# Patient Record
Sex: Female | Born: 1975 | Race: White | Hispanic: No | Marital: Single | Smoking: Never smoker
Health system: Southern US, Community
[De-identification: ages and names within clinical notes are randomized; demographics above are authoritative.]

## PROBLEM LIST (undated history)

## (undated) DIAGNOSIS — I499 Cardiac arrhythmia, unspecified: Secondary | ICD-10-CM

## (undated) DIAGNOSIS — R011 Cardiac murmur, unspecified: Secondary | ICD-10-CM

## (undated) DIAGNOSIS — I10 Essential (primary) hypertension: Secondary | ICD-10-CM

## (undated) DIAGNOSIS — Z8659 Personal history of other mental and behavioral disorders: Secondary | ICD-10-CM

## (undated) DIAGNOSIS — Z8679 Personal history of other diseases of the circulatory system: Secondary | ICD-10-CM

## (undated) DIAGNOSIS — E119 Type 2 diabetes mellitus without complications: Secondary | ICD-10-CM

## (undated) DIAGNOSIS — Z8709 Personal history of other diseases of the respiratory system: Secondary | ICD-10-CM

## (undated) HISTORY — DX: Cardiac murmur, unspecified: R01.1

## (undated) HISTORY — DX: Personal history of other diseases of the respiratory system: Z87.09

## (undated) HISTORY — DX: Essential (primary) hypertension: I10

## (undated) HISTORY — DX: Personal history of other mental and behavioral disorders: Z86.59

## (undated) HISTORY — DX: Cardiac arrhythmia, unspecified: I49.9

## (undated) HISTORY — DX: Personal history of other diseases of the circulatory system: Z86.79

---

## 2004-12-20 ENCOUNTER — Emergency Department: Payer: Self-pay | Admitting: Emergency Medicine

## 2005-05-10 ENCOUNTER — Emergency Department: Payer: Self-pay | Admitting: Emergency Medicine

## 2006-09-26 ENCOUNTER — Emergency Department: Payer: Self-pay | Admitting: Emergency Medicine

## 2006-11-22 IMAGING — CT CT HEAD WITHOUT CONTRAST
2 series · 16 of 30 positions shown, 20 images · non-contrast
Comparison: none

REASON FOR EXAM: Fell.
COMMENTS:

[Series 2: without · axial · non-contrast · 0.39mm/px · z∈[+416,+536]mm · 13 of 30 slices shown, 17 images]
[im 3/30  brain]
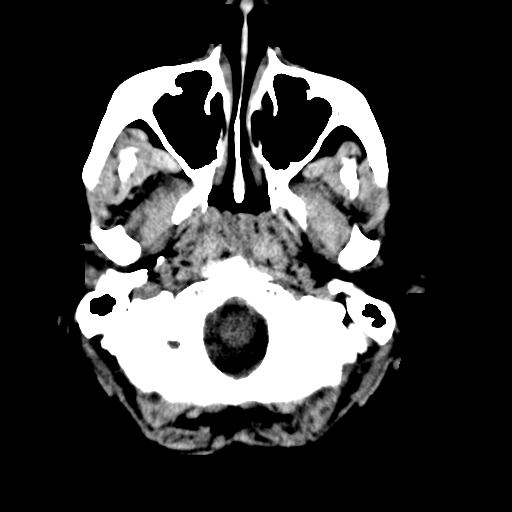
[im 3/30  bone]
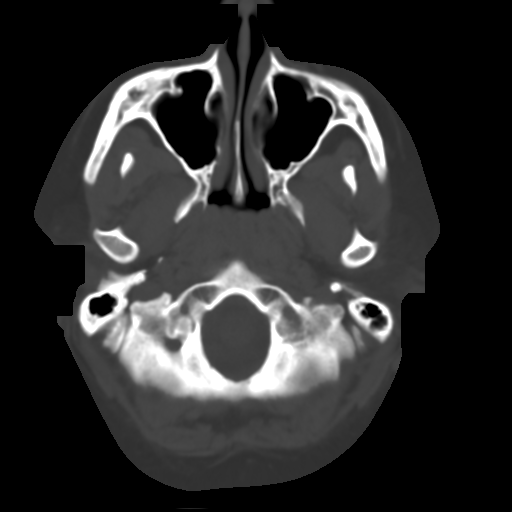
[im 5/30  brain]
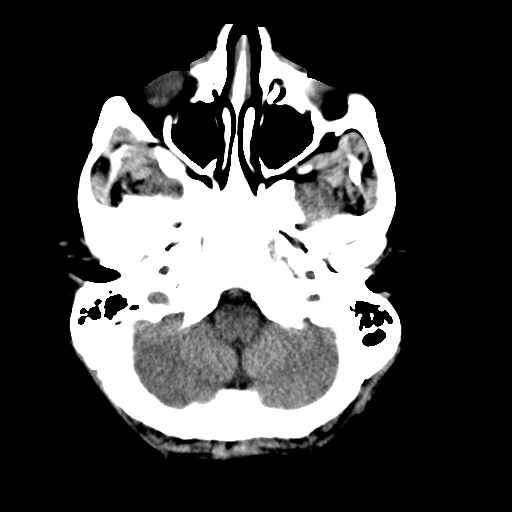
[im 7/30  brain]
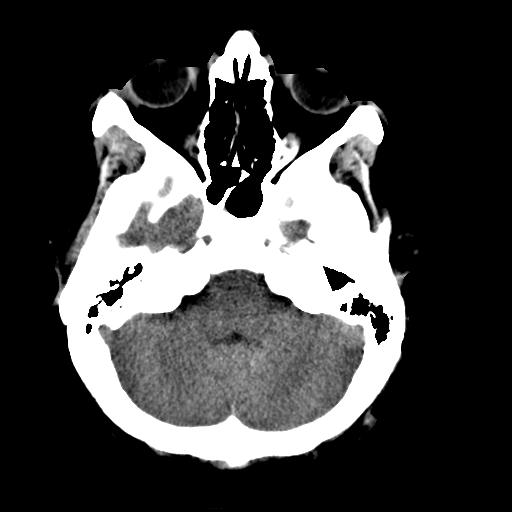
[im 9/30  brain]
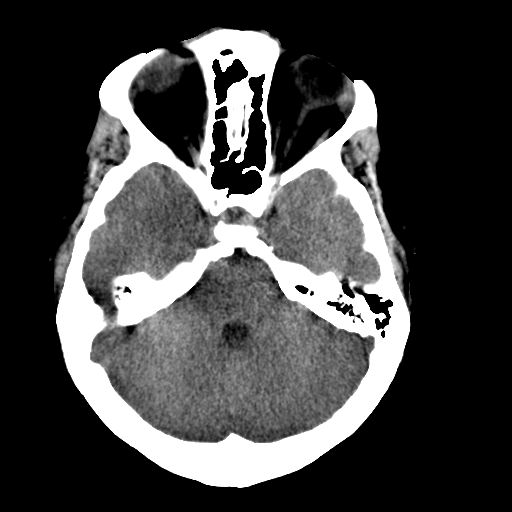
[im 11/30  brain]
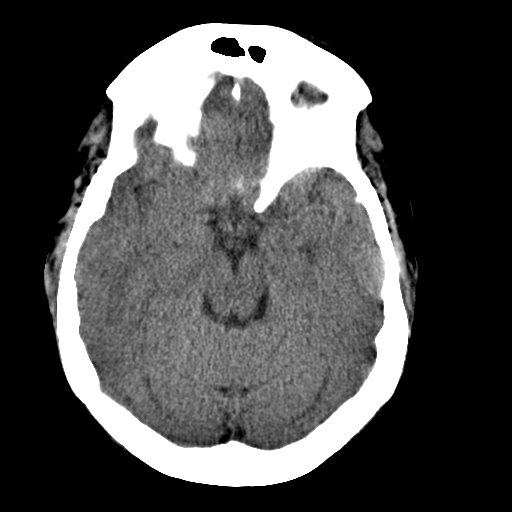
[im 11/30  bone]
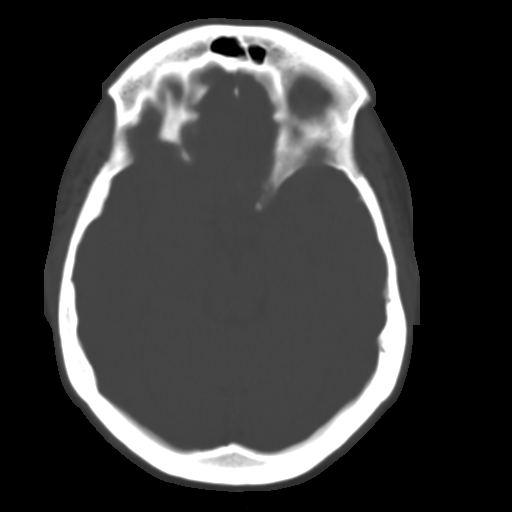
[im 13/30  brain]
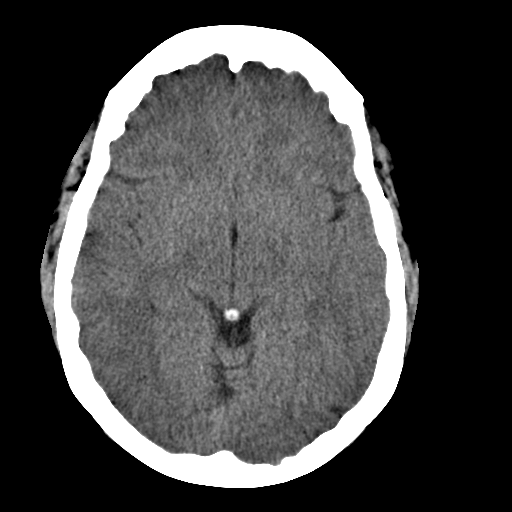
[im 15/30  brain]
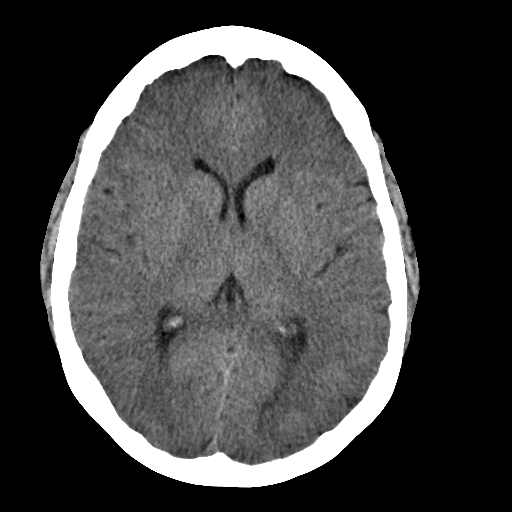
[im 17/30  brain]
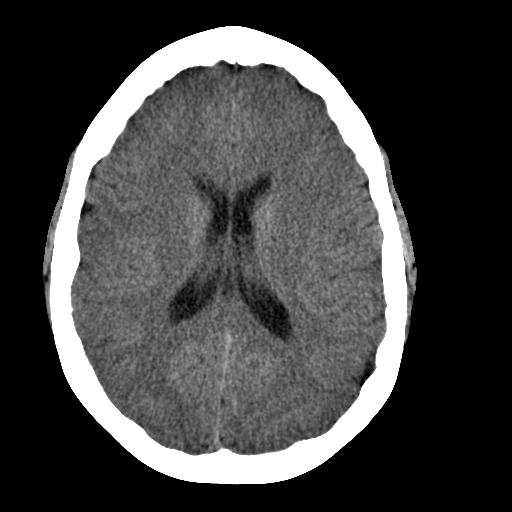
[im 19/30  brain]
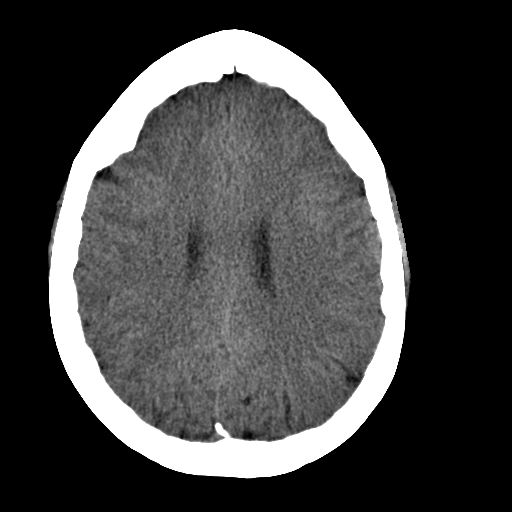
[im 19/30  bone]
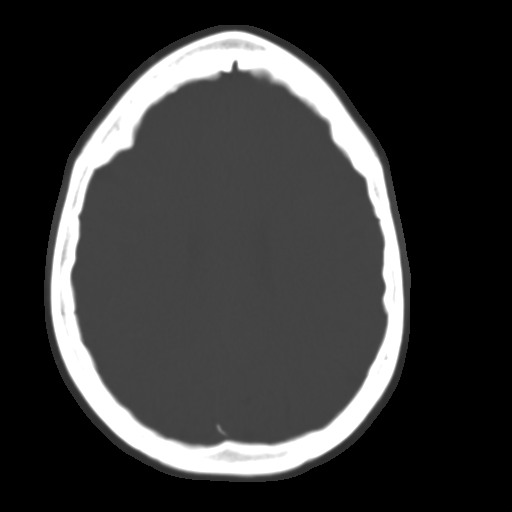
[im 21/30  brain]
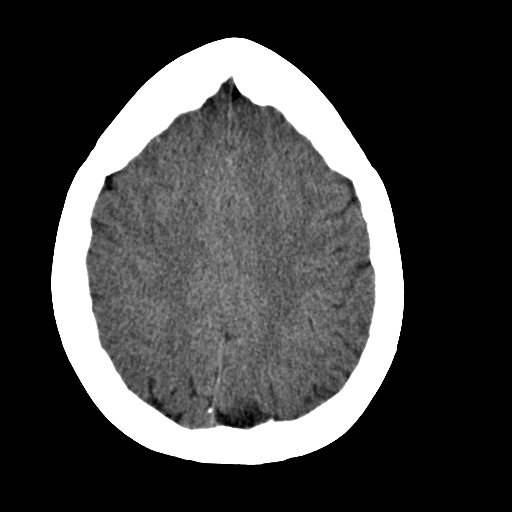
[im 23/30  brain]
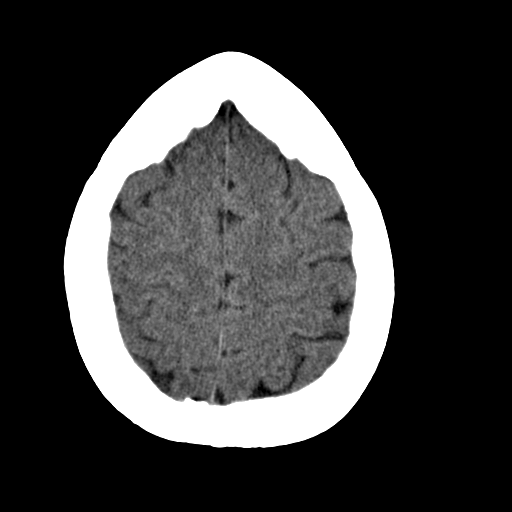
[im 25/30  brain]
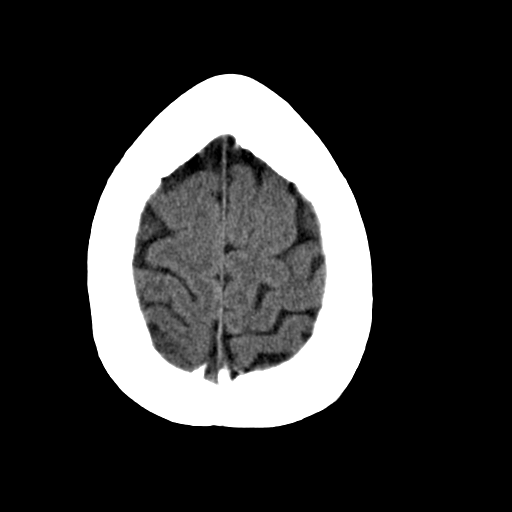
[im 27/30  brain]
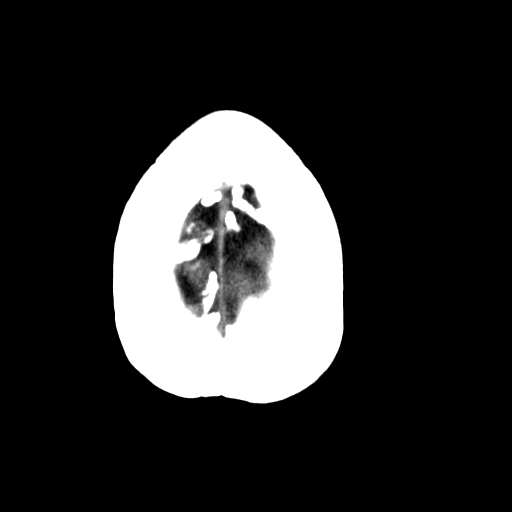
[im 27/30  bone]
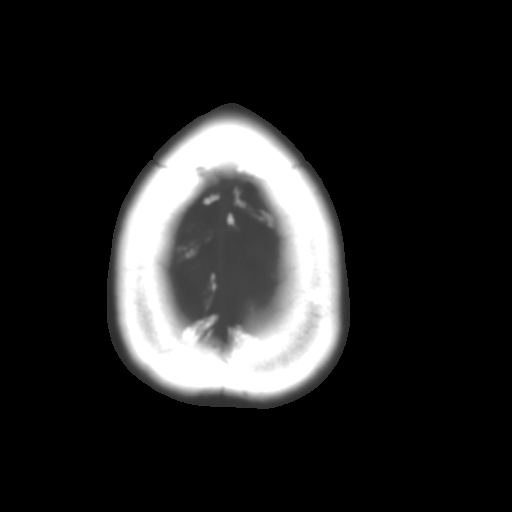

[Series 3: bone · axial · 0.39mm/px · z∈[+416,+456]mm · 3 of 30 slices shown]
[im 3/30  bone]
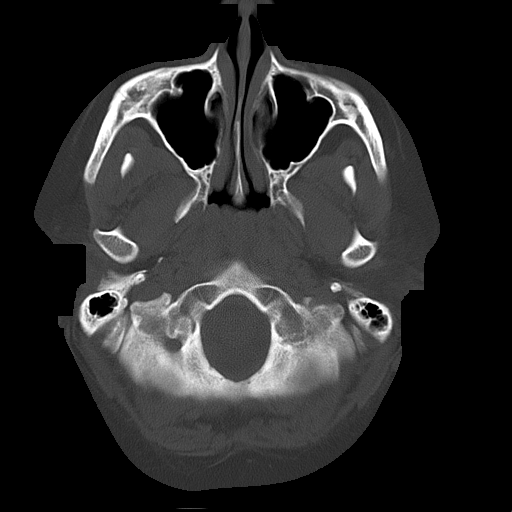
[im 7/30  bone]
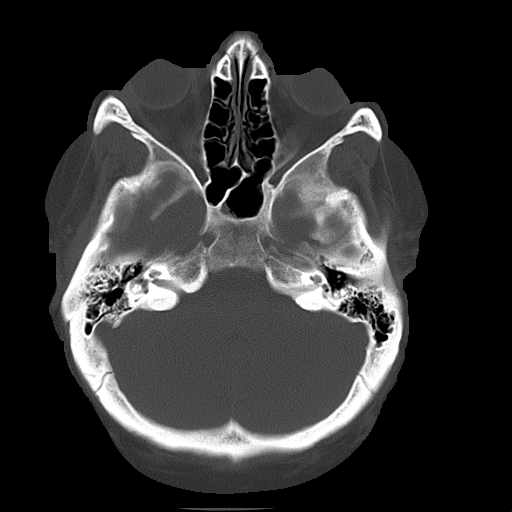
[im 11/30  bone]
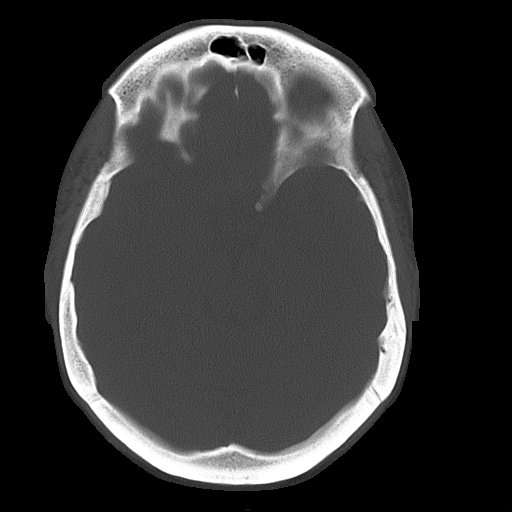

[16 of 30 positions shown; findings below may reference images not displayed]

PROCEDURE:     CT  - CT HEAD WITHOUT CONTRAST  - May 10, 2005  [DATE]

RESULT:       Unenhanced emergent head CT was performed status post fall.
The exam was originally read by the [HOSPITAL].   No intracerebral bleeds
are identified.  No mass effect and no shift of the midline.  The ventricles
appear within normal limits.  No subdural hematomas are noted.

On the bone window settings, no obvious fractures are seen.
IMPRESSION: No significant abnormalities identified on the unenhanced head CT.

## 2006-11-22 IMAGING — CR LEFT WRIST - COMPLETE 3+ VIEW
1 series · 2 of 2 positions shown · non-contrast
Comparison: none

REASON FOR EXAM: Fell. Injury.
COMMENTS:

PROCEDURE:     DXR - DXR WRIST LT COMP WITH OBLIQUES  - May 10, 2005  [DATE]
RESULT:       Multiple views reveals no fractures or dislocations.  The
joint spaces are intact.

[Series 1: view not recorded · 0.17mm/px · 2 of 2 slices shown]
[im 1/2]
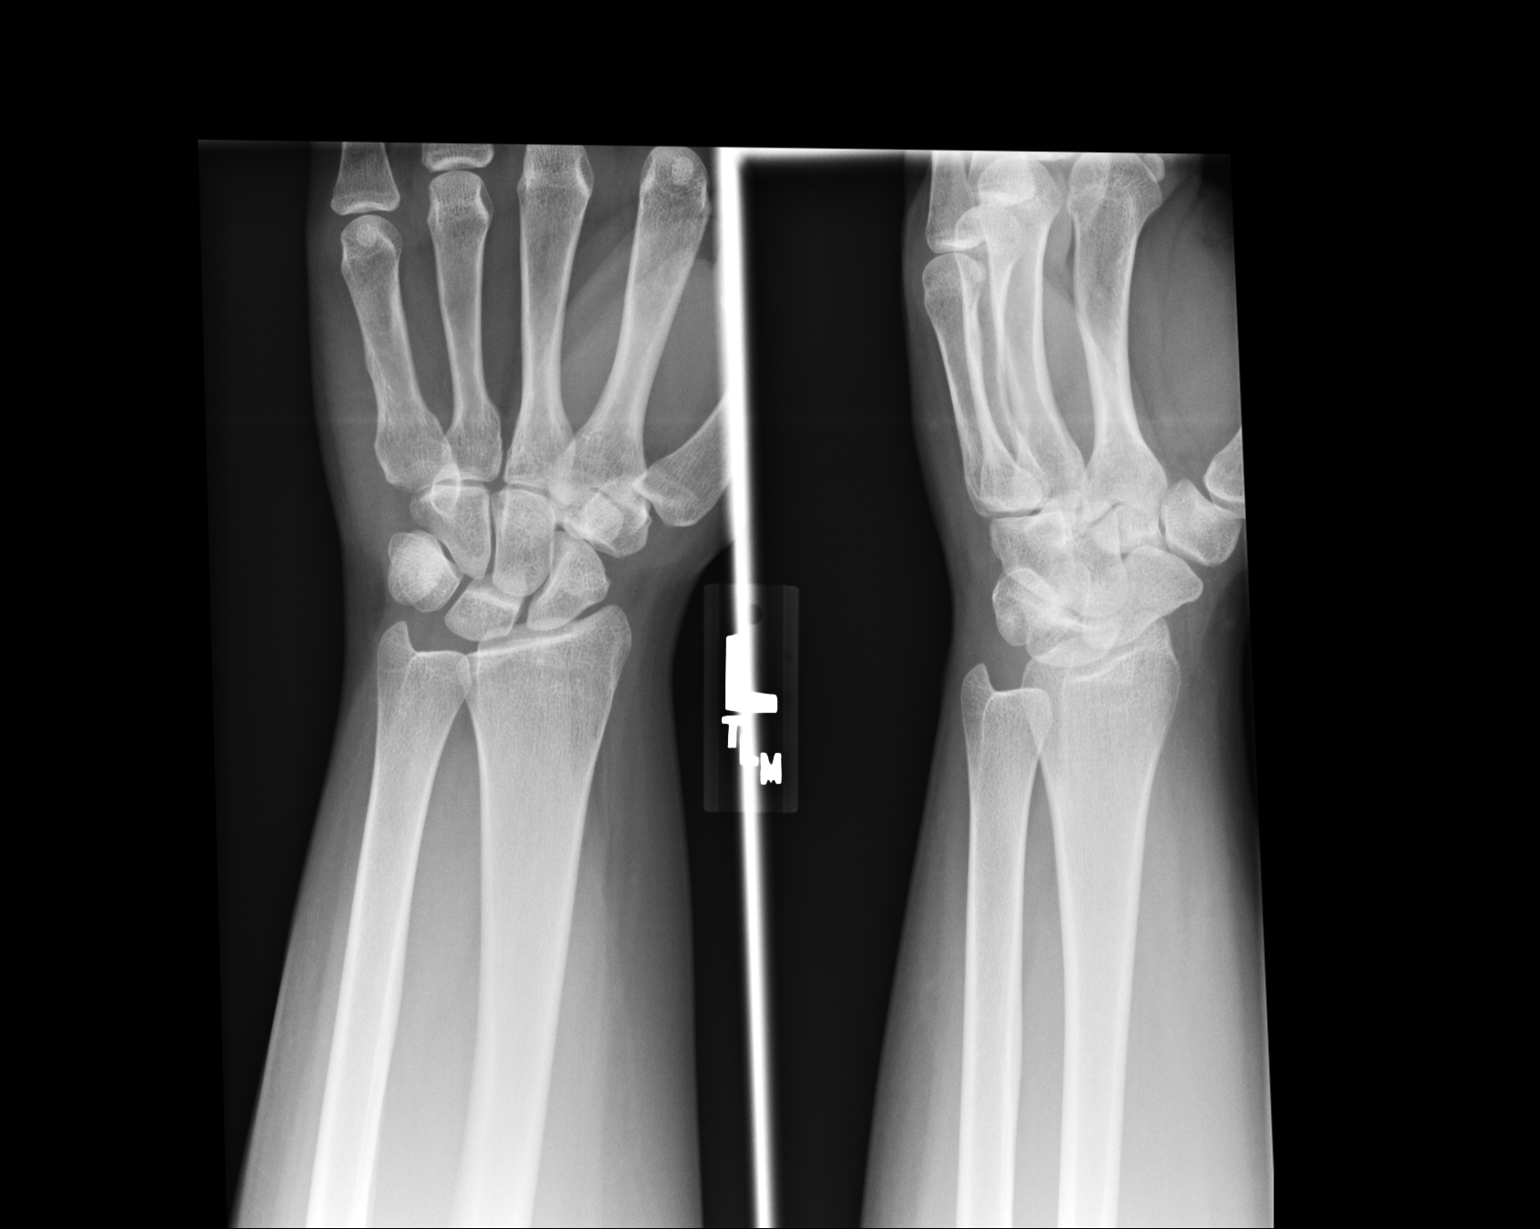
[im 2/2]
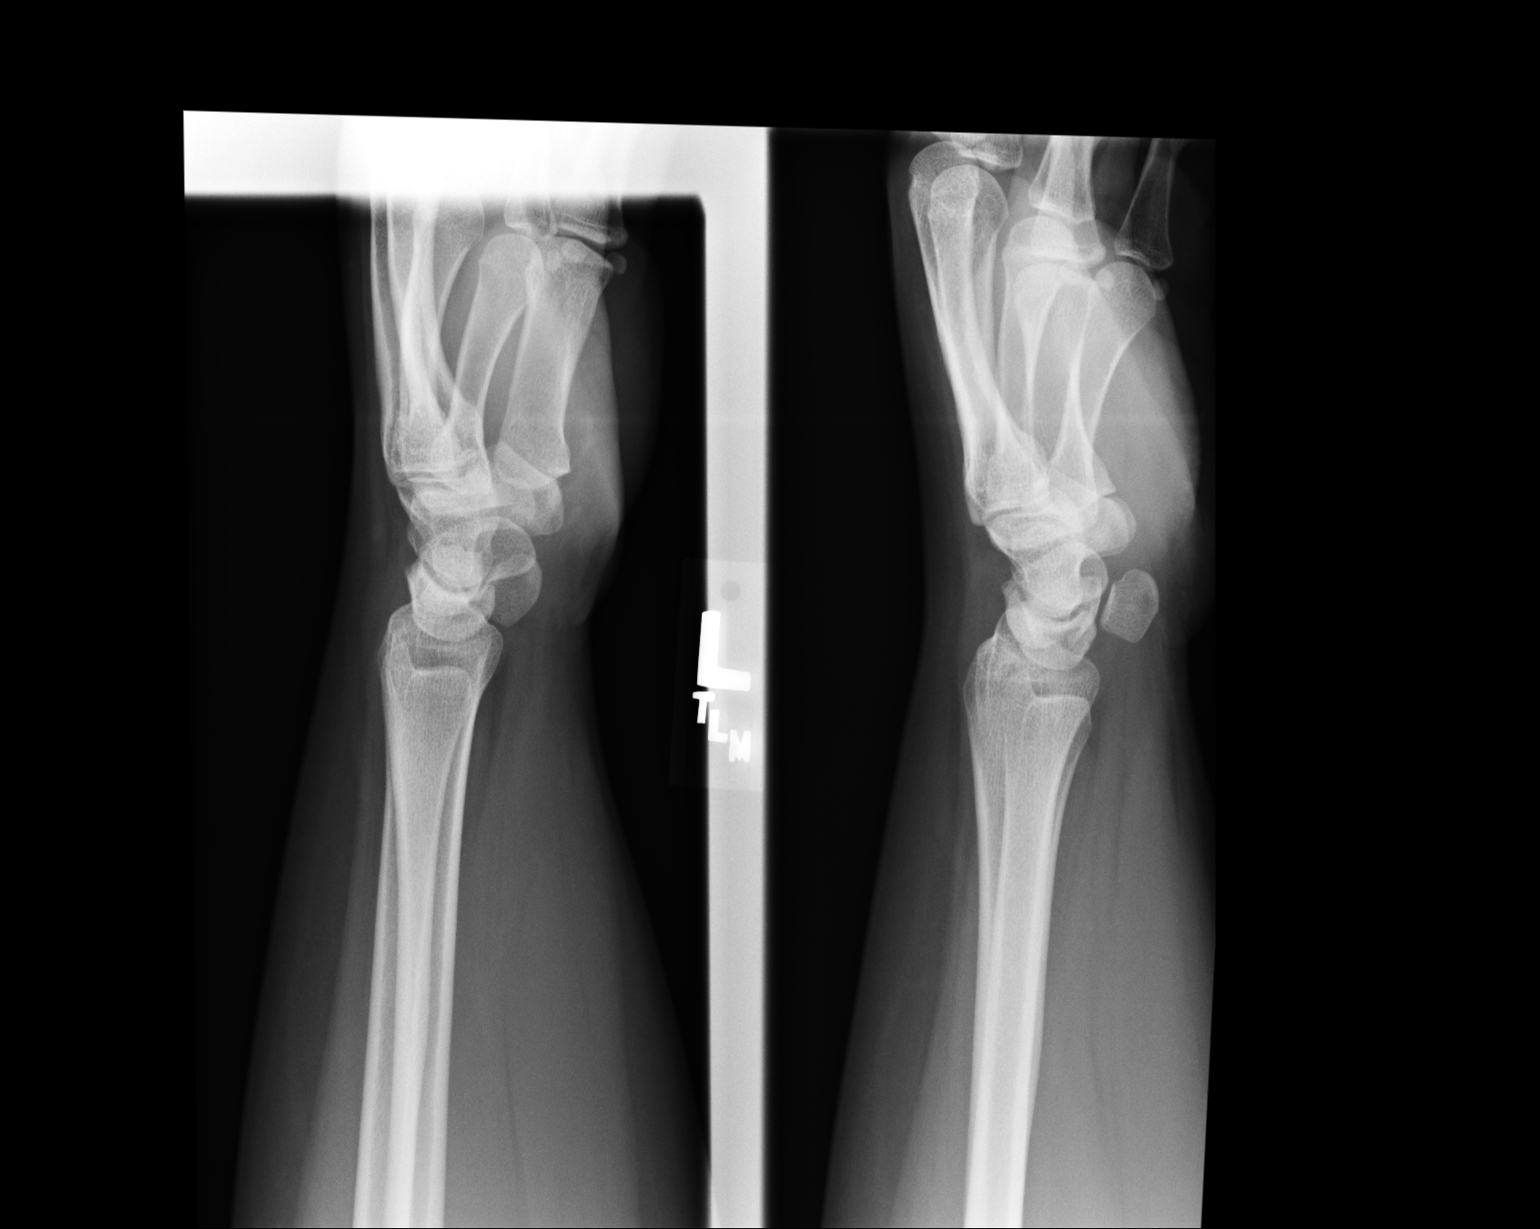

[2 of 2 positions shown; findings below may reference images not displayed]

IMPRESSION: No acute fracture seen of the LEFT wrist.

Thank you for this opportunity to contribute to the care of your patient.

A NEGATIVE MAMMOGRAM REPORT DOES NOT PRECLUDE BIOPSY OR OTHER EVALUATION OF
A CLINICALLY PALPABLE OR OTHERWISE SUSPICIOUS MASS OR LESION.   BREAST

## 2007-08-26 ENCOUNTER — Emergency Department: Payer: Self-pay | Admitting: Emergency Medicine

## 2008-09-27 ENCOUNTER — Emergency Department: Payer: Self-pay | Admitting: Emergency Medicine

## 2008-10-26 ENCOUNTER — Emergency Department: Payer: Self-pay | Admitting: Emergency Medicine

## 2009-08-10 ENCOUNTER — Emergency Department: Payer: Self-pay | Admitting: Emergency Medicine

## 2010-02-01 ENCOUNTER — Emergency Department: Payer: Self-pay | Admitting: Emergency Medicine

## 2010-07-31 ENCOUNTER — Emergency Department: Payer: Self-pay | Admitting: Emergency Medicine

## 2011-08-02 ENCOUNTER — Emergency Department: Payer: Self-pay | Admitting: Emergency Medicine

## 2011-08-02 LAB — PREGNANCY, URINE: Pregnancy Test, Urine: NEGATIVE m[IU]/mL

## 2013-02-24 ENCOUNTER — Inpatient Hospital Stay: Payer: Self-pay | Admitting: Internal Medicine

## 2013-02-24 LAB — CBC
HCT: 44 % (ref 35.0–47.0)
MCHC: 33.5 g/dL (ref 32.0–36.0)
Platelet: 255 10*3/uL (ref 150–440)
RBC: 5.33 10*6/uL — ABNORMAL HIGH (ref 3.80–5.20)
RDW: 14.7 % — ABNORMAL HIGH (ref 11.5–14.5)

## 2013-02-24 LAB — BASIC METABOLIC PANEL
Chloride: 106 mmol/L (ref 98–107)
Co2: 32 mmol/L (ref 21–32)
Creatinine: 0.91 mg/dL (ref 0.60–1.30)
EGFR (African American): >60
Osmolality: 282 (ref 275–301)

## 2013-02-24 LAB — CK TOTAL AND CKMB (NOT AT ARMC)
CK, Total: 284 U/L — ABNORMAL HIGH (ref 21–215)
CK-MB: 3 ng/mL (ref 0.5–3.6)
CK-MB: 2.4 ng/mL (ref 0.5–3.6)
CK-MB: 2.8 ng/mL (ref 0.5–3.6)

## 2013-02-24 LAB — TROPONIN I: Troponin-I: 0.05 ng/mL

## 2013-02-24 LAB — SEDIMENTATION RATE: Erythrocyte Sed Rate: 4 mm/hr (ref 0–20)

## 2013-02-24 LAB — TSH: Thyroid Stimulating Horm: 3.38 u[IU]/mL

## 2013-02-25 LAB — COMPREHENSIVE METABOLIC PANEL
Albumin: 3.1 g/dL — ABNORMAL LOW (ref 3.4–5.0)
Alkaline Phosphatase: 39 U/L — ABNORMAL LOW (ref 50–136)
Anion Gap: 4 — ABNORMAL LOW (ref 7–16)
Bilirubin,Total: 1.1 mg/dL — ABNORMAL HIGH (ref 0.2–1.0)
Calcium, Total: 8.8 mg/dL (ref 8.5–10.1)
Co2: 31 mmol/L (ref 21–32)
EGFR (African American): >60
EGFR (Non-African Amer.): >60
Glucose: 93 mg/dL (ref 65–99)
Osmolality: 282 (ref 275–301)
SGOT(AST): 15 U/L (ref 15–37)
Sodium: 141 mmol/L (ref 136–145)
Total Protein: 6.9 g/dL (ref 6.4–8.2)

## 2013-02-25 LAB — CBC WITH DIFFERENTIAL/PLATELET
Eosinophil %: 2.9 %
HGB: 13.4 g/dL (ref 12.0–16.0)
Lymphocyte #: 3.1 10*3/uL (ref 1.0–3.6)
Lymphocyte %: 30.3 %
MCHC: 34.2 g/dL (ref 32.0–36.0)
Monocyte %: 8.1 %
Platelet: 206 10*3/uL (ref 150–440)

## 2013-02-25 LAB — LIPID PANEL
Cholesterol: 144 mg/dL (ref 0–200)
VLDL Cholesterol, Calc: 19 mg/dL (ref 5–40)

## 2013-02-28 LAB — CULTURE, BLOOD (SINGLE)

## 2014-02-21 ENCOUNTER — Encounter: Payer: Self-pay | Admitting: Family Medicine

## 2014-02-21 ENCOUNTER — Ambulatory Visit (INDEPENDENT_AMBULATORY_CARE_PROVIDER_SITE_OTHER): Payer: No Typology Code available for payment source | Admitting: Family Medicine

## 2014-02-21 VITALS — BP 142/90 | HR 76 | Temp 98.7°F | Ht 67.0 in | Wt 288.5 lb

## 2014-02-21 DIAGNOSIS — Z8659 Personal history of other mental and behavioral disorders: Secondary | ICD-10-CM

## 2014-02-21 DIAGNOSIS — E669 Obesity, unspecified: Secondary | ICD-10-CM

## 2014-02-21 DIAGNOSIS — I10 Essential (primary) hypertension: Secondary | ICD-10-CM

## 2014-02-21 DIAGNOSIS — E1159 Type 2 diabetes mellitus with other circulatory complications: Secondary | ICD-10-CM | POA: Insufficient documentation

## 2014-02-21 MED ORDER — LISINOPRIL-HYDROCHLOROTHIAZIDE 10-12.5 MG PO TABS: 1.0000 | ORAL_TABLET | Freq: Every day | ORAL | Status: DC
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
## 2014-02-21 NOTE — Progress Notes (Signed)
Pre visit review using our clinic review tool, if applicable. No additional management support is needed unless otherwise documented below in the visit note. 

## 2014-02-21 NOTE — Assessment & Plan Note (Signed)
Discussed how this problem influences overall health and the risks it imposes  Reviewed plan for weight loss with lower calorie diet (via better food choices and also portion control or program like weight watchers) and exercise building up to or more than 30 minutes 5 days per week including some aerobic activity   Pt is loosing weight steadily on program with supplement (chromium/herbs)-no side eff, disc risks of this but commended good diet and exercise

## 2014-02-21 NOTE — Patient Instructions (Signed)
Start lisinopril 10-12.5 mg one pill each am (if you develop worse cough or dizziness or other symptoms stop it and update me)  Make sure to drink enough water  Keep up the healthy diet and exercise also  Use caution with your weight loss supplement  Schedule fasting labs in 2 weeks Follow up with me in 3-4 weeks

## 2014-02-21 NOTE — Assessment & Plan Note (Signed)
Pt has had lopressor when hospitalized -otherwise no tx  On progesterone only contraception  Working on weight loss - disc dash diet  Has some edema Trial of lisinopril hct 10-12.5 -disc side eff potential  Lab 2 wk/ f/u 3-4 wk

## 2014-03-07 ENCOUNTER — Other Ambulatory Visit: Payer: No Typology Code available for payment source

## 2014-03-08 ENCOUNTER — Encounter: Payer: Self-pay | Admitting: *Deleted

## 2014-03-08 ENCOUNTER — Other Ambulatory Visit (INDEPENDENT_AMBULATORY_CARE_PROVIDER_SITE_OTHER): Payer: No Typology Code available for payment source

## 2014-03-08 DIAGNOSIS — I10 Essential (primary) hypertension: Secondary | ICD-10-CM

## 2014-03-08 LAB — COMPREHENSIVE METABOLIC PANEL
ALK PHOS: 34 U/L — AB (ref 39–117)
ALT: 15 U/L (ref 0–35)
AST: 15 U/L (ref 0–37)
Albumin: 3.8 g/dL (ref 3.5–5.2)
BUN: 20 mg/dL (ref 6–23)
CALCIUM: 9 mg/dL (ref 8.4–10.5)
CHLORIDE: 101 meq/L (ref 96–112)
CO2: 25 mEq/L (ref 19–32)
Creatinine, Ser: 1 mg/dL (ref 0.4–1.2)
GFR: 67.17 mL/min (ref >60.00)
Glucose, Bld: 101 mg/dL — ABNORMAL HIGH (ref 70–99)
Potassium: 3.9 mEq/L (ref 3.5–5.1)
Sodium: 136 mEq/L (ref 135–145)
Total Bilirubin: 1.1 mg/dL (ref 0.2–1.2)
Total Protein: 7.6 g/dL (ref 6.0–8.3)

## 2014-03-08 LAB — CBC WITH DIFFERENTIAL/PLATELET
BASOS ABS: 0 10*3/uL (ref 0.0–0.1)
BASOS PCT: 0.5 % (ref 0.0–3.0)
EOS ABS: 0.2 10*3/uL (ref 0.0–0.7)
Eosinophils Relative: 1.9 % (ref 0.0–5.0)
HCT: 40.2 % (ref 36.0–46.0)
Hemoglobin: 13.1 g/dL (ref 12.0–15.0)
LYMPHS PCT: 29.8 % (ref 12.0–46.0)
Lymphs Abs: 2.9 10*3/uL (ref 0.7–4.0)
MCHC: 32.6 g/dL (ref 30.0–36.0)
MCV: 80.9 fl (ref 78.0–100.0)
MONO ABS: 0.6 10*3/uL (ref 0.1–1.0)
Monocytes Relative: 6.4 % (ref 3.0–12.0)
Neutro Abs: 6.1 10*3/uL (ref 1.4–7.7)
Neutrophils Relative %: 61.4 % (ref 43.0–77.0)
Platelets: 249 10*3/uL (ref 150.0–400.0)
RBC: 4.97 Mil/uL (ref 3.87–5.11)
RDW: 15.3 % (ref 11.5–15.5)
WBC: 9.9 10*3/uL (ref 4.0–10.5)

## 2014-03-08 LAB — LIPID PANEL
CHOL/HDL RATIO: 5
Cholesterol: 167 mg/dL (ref 0–200)
HDL: 31.9 mg/dL — AB (ref >39.00)
LDL CALC: 107 mg/dL — AB (ref 0–99)
NONHDL: 135.1
TRIGLYCERIDES: 141 mg/dL (ref 0.0–149.0)
VLDL: 28.2 mg/dL (ref 0.0–40.0)

## 2014-03-08 LAB — TSH: TSH: 0.92 u[IU]/mL (ref 0.35–4.50)

## 2014-03-21 ENCOUNTER — Ambulatory Visit (INDEPENDENT_AMBULATORY_CARE_PROVIDER_SITE_OTHER): Payer: No Typology Code available for payment source | Admitting: Family Medicine

## 2014-03-21 ENCOUNTER — Encounter: Payer: Self-pay | Admitting: Family Medicine

## 2014-03-21 VITALS — BP 124/82 | HR 98 | Temp 99.2°F | Ht 67.0 in | Wt 288.5 lb

## 2014-03-21 DIAGNOSIS — E786 Lipoprotein deficiency: Secondary | ICD-10-CM | POA: Insufficient documentation

## 2014-03-21 DIAGNOSIS — I10 Essential (primary) hypertension: Secondary | ICD-10-CM

## 2014-03-21 MED ORDER — LISINOPRIL-HYDROCHLOROTHIAZIDE 10-12.5 MG PO TABS: 1.0000 | ORAL_TABLET | Freq: Every day | ORAL | Status: DC

## 2014-03-21 NOTE — Assessment & Plan Note (Signed)
Rev lipid profile  Disc ways to inc HDL - primarily exercise (plans to start a walking program) And also fish/ omega 3 supplement Will plan to check yearly

## 2014-03-21 NOTE — Progress Notes (Signed)
Pre visit review using our clinic review tool, if applicable. No additional management support is needed unless otherwise documented below in the visit note. 

## 2014-03-21 NOTE — Patient Instructions (Signed)
Blood pressure looks great  To increase HDL (good cholesterol)- increase exercise and eat fish and /or take omega 3 fish oil  Take care of yourself

## 2014-03-21 NOTE — Progress Notes (Signed)
Subjective:    Patient ID: Sheila Hicks, female    DOB: Aug 12, 1975, 38 y.o.   MRN: [id number]  HPI Here for f/u of HTN  bp is a lot better  Also she feels a lot less swollen    Last visit started lisinopril hct 12.5 mg  BP Readings from Last 3 Encounters:  03/21/14 124/82  02/21/14 142/90    Had labs later  Results for orders placed in visit on 03/08/14  CBC WITH DIFFERENTIAL      Result Value Ref Range   WBC 9.9  4.0 - 10.5 K/uL   RBC 4.97  3.87 - 5.11 Mil/uL   Hemoglobin 13.1  12.0 - 15.0 g/dL   HCT 40.2  36.0 - 46.0 %   MCV 80.9  78.0 - 100.0 fl   MCHC 32.6  30.0 - 36.0 g/dL   RDW 15.3  11.5 - 15.5 %   Platelets 249.0  150.0 - 400.0 K/uL   Neutrophils Relative % 61.4  43.0 - 77.0 %   Lymphocytes Relative 29.8  12.0 - 46.0 %   Monocytes Relative 6.4  3.0 - 12.0 %   Eosinophils Relative 1.9  0.0 - 5.0 %   Basophils Relative 0.5  0.0 - 3.0 %   Neutro Abs 6.1  1.4 - 7.7 K/uL   Lymphs Abs 2.9  0.7 - 4.0 K/uL   Monocytes Absolute 0.6  0.1 - 1.0 K/uL   Eosinophils Absolute 0.2  0.0 - 0.7 K/uL   Basophils Absolute 0.0  0.0 - 0.1 K/uL  COMPREHENSIVE METABOLIC PANEL      Result Value Ref Range   Sodium 136  135 - 145 mEq/L   Potassium 3.9  3.5 - 5.1 mEq/L   Chloride 101  96 - 112 mEq/L   CO2 25  19 - 32 mEq/L   Glucose, Bld 101 (*) 70 - 99 mg/dL   BUN 20  6 - 23 mg/dL   Creatinine, Ser 1.0  0.4 - 1.2 mg/dL   Total Bilirubin 1.1  0.2 - 1.2 mg/dL   Alkaline Phosphatase 34 (*) 39 - 117 U/L   AST 15  0 - 37 U/L   ALT 15  0 - 35 U/L   Total Protein 7.6  6.0 - 8.3 g/dL   Albumin 3.8  3.5 - 5.2 g/dL   Calcium 9.0  8.4 - 10.5 mg/dL   GFR 67.17  >60.00 mL/min  LIPID PANEL      Result Value Ref Range   Cholesterol 167  0 - 200 mg/dL   Triglycerides 141.0  0.0 - 149.0 mg/dL   HDL 31.90 (*) >39.00 mg/dL   VLDL 28.2  0.0 - 40.0 mg/dL   LDL Cholesterol 107 (*) 0 - 99 mg/dL   Total CHOL/HDL Ratio 5     NonHDL 135.10    TSH      Result Value Ref Range   TSH 0.92  0.35  - 4.50 uIU/mL    HDL is low  She means to eat more fish  Exercise - she is ready to start now that her bp is good - will start walking   Patient Active Problem List   Diagnosis Date Noted  . Essential hypertension, benign 02/21/2014  . Obesity 02/21/2014  . History of depression 02/21/2014   Past Medical History  Diagnosis Date  . History of depression   . History of hay fever   . Arrhythmia   . History of hypertension    No  past surgical history on file. History  Substance Use Topics  . Smoking status: Never Smoker   . Smokeless tobacco: Not on file  . Alcohol Use: No   Family History  Problem Relation Age of Onset  . Hypertension Father   . Hypertension Mother   . Hypertension Maternal Grandmother   . Diabetes Mother   . Diabetes Mother   . Diabetes Maternal Grandmother    No Known Allergies Current Outpatient Prescriptions on File Prior to Visit  Medication Sig Dispense Refill  . lisinopril-hydrochlorothiazide (PRINZIDE,ZESTORETIC) 10-12.5 MG per tablet Take 1 tablet by mouth daily.  30 tablet  3   No current facility-administered medications on file prior to visit.     Review of Systems Review of Systems  Constitutional: Negative for fever, appetite change, fatigue and unexpected weight change.  Eyes: Negative for pain and visual disturbance.  Respiratory: Negative for cough and shortness of breath.   Cardiovascular: Negative for cp or palpitations    Gastrointestinal: Negative for nausea, diarrhea and constipation.  Genitourinary: Negative for urgency and frequency.  Skin: Negative for pallor or rash   Neurological: Negative for weakness, light-headedness, numbness and headaches.  Hematological: Negative for adenopathy. Does not bruise/bleed easily.  Psychiatric/Behavioral: Negative for dysphoric mood. The patient is not nervous/anxious.         Objective:   Physical Exam  Constitutional: She appears well-developed and well-nourished.  obese and well  appearing   HENT:  Head: Normocephalic and atraumatic.  Eyes: Conjunctivae and EOM are normal. Pupils are equal, round, and reactive to light. No scleral icterus.  Neck: Normal range of motion. Neck supple. No thyromegaly present.  Cardiovascular: Normal rate and regular rhythm.   Pulmonary/Chest: Effort normal and breath sounds normal. She has no wheezes.  Musculoskeletal: She exhibits no edema.  Lymphadenopathy:    She has no cervical adenopathy.  Neurological: She is alert. She has normal reflexes. She exhibits normal muscle tone. Coordination normal.  Skin: Skin is warm and dry. No rash noted. No erythema. No pallor.  Psychiatric: She has a normal mood and affect.          Assessment & Plan:   Problem List Items Addressed This Visit     Cardiovascular and Mediastinum   Essential hypertension, benign - Primary     Improved with lisinopril hct  Rev labs  Pt is pleased  Disc plan for wt loss and healthy diet       Relevant Medications      lisinopril-hydrochlorothiazide (PRINZIDE,ZESTORETIC) 10-12.5 MG per tablet     Other   Low HDL (under 40)     Rev lipid profile  Disc ways to inc HDL - primarily exercise (plans to start a walking program) And also fish/ omega 3 supplement Will plan to check yearly

## 2014-03-21 NOTE — Assessment & Plan Note (Signed)
Improved with lisinopril hct  Rev labs  Pt is pleased  Disc plan for wt loss and healthy diet

## 2014-04-06 ENCOUNTER — Ambulatory Visit (INDEPENDENT_AMBULATORY_CARE_PROVIDER_SITE_OTHER): Payer: No Typology Code available for payment source | Admitting: Family Medicine

## 2014-04-06 ENCOUNTER — Encounter: Payer: Self-pay | Admitting: Family Medicine

## 2014-04-06 VITALS — BP 104/66 | HR 103 | Temp 99.4°F | Ht 67.0 in | Wt 287.2 lb

## 2014-04-06 DIAGNOSIS — J029 Acute pharyngitis, unspecified: Secondary | ICD-10-CM

## 2014-04-06 DIAGNOSIS — J039 Acute tonsillitis, unspecified: Secondary | ICD-10-CM | POA: Insufficient documentation

## 2014-04-06 LAB — POCT RAPID STREP A (OFFICE): Rapid Strep A Screen: NEGATIVE

## 2014-04-06 MED ORDER — AMOXICILLIN 500 MG PO CAPS: 500.0000 mg | ORAL_CAPSULE | Freq: Three times a day (TID) | ORAL | Status: DC

## 2014-04-06 NOTE — Assessment & Plan Note (Signed)
Rapid strep neg  Enlarged tonsils with exudate and fever Cover with amoxicillin Update if not starting to improve in a week or if worsening  Disc symptomatic care - see instructions on AVS

## 2014-04-06 NOTE — Patient Instructions (Signed)
Take amoxicillin for tonsillitis  Strep test is negative  Drink fluids Tylenol for fever  Gargle with salt water for sore throat  Also you can chloraseptic throat spray   Update if not starting to improve in a week or if worsening

## 2014-04-06 NOTE — Progress Notes (Signed)
Pre visit review using our clinic review tool, if applicable. No additional management support is needed unless otherwise documented below in the visit note. 

## 2014-04-06 NOTE — Progress Notes (Signed)
Subjective:    Patient ID: Sheila Hicks, female    DOB: 1975-08-05, 38 y.o.   MRN: [id number]  HPI Here for a sore throat  Started getting Monday - and some drip  Wondered if she had strep  Yesterday and today - white spots in her throat  Also fever  Tylenol is due around 4 pm and her temp is going up  Chills and sweats   Is also clearing throat and coughing (dry)   No rash   No strep exp that she knows of   Results for orders placed in visit on 04/06/14  POCT RAPID STREP A (OFFICE)      Result Value Ref Range   Rapid Strep A Screen Negative  Negative    Patient Active Problem List   Diagnosis Date Noted  . Low HDL (under 40) 03/21/2014  . Essential hypertension, benign 02/21/2014  . Obesity 02/21/2014  . History of depression 02/21/2014   Past Medical History  Diagnosis Date  . History of depression   . History of hay fever   . Arrhythmia   . History of hypertension    No past surgical history on file. History  Substance Use Topics  . Smoking status: Never Smoker   . Smokeless tobacco: Not on file  . Alcohol Use: No   Family History  Problem Relation Age of Onset  . Hypertension Father   . Hypertension Mother   . Hypertension Maternal Grandmother   . Diabetes Mother   . Diabetes Mother   . Diabetes Maternal Grandmother    No Known Allergies Current Outpatient Prescriptions on File Prior to Visit  Medication Sig Dispense Refill  . ibuprofen (ADVIL,MOTRIN) 200 MG tablet Take 4 mg by mouth every 8 (eight) hours as needed.      Marland Kitchen lisinopril-hydrochlorothiazide (PRINZIDE,ZESTORETIC) 10-12.5 MG per tablet Take 1 tablet by mouth daily.  30 tablet  11   No current facility-administered medications on file prior to visit.    Review of Systems Review of Systems  Constitutional: Negative for , appetite change, and unexpected weight change. pos for fatigue and malaise  ENT pos for ST with swollen tonsils and post nasal drip/ neg for nasal congestion or  sinus pain  Eyes: Negative for pain and visual disturbance.  Respiratory: Negative for cough and shortness of breath.   Cardiovascular: Negative for cp or palpitations    Gastrointestinal: Negative for nausea, diarrhea and constipation.  Genitourinary: Negative for urgency and frequency.  Skin: Negative for pallor or rash   Neurological: Negative for weakness, light-headedness, numbness and headaches.  Hematological: Negative for adenopathy. Does not bruise/bleed easily.  Psychiatric/Behavioral: Negative for dysphoric mood. The patient is not nervous/anxious.         Objective:   Physical Exam  Constitutional: She appears well-developed and well-nourished. No distress.  Obese and fatigued appearing   HENT:  Head: Normocephalic and atraumatic.  Right Ear: External ear normal.  Left Ear: External ear normal.  Mouth/Throat: Oropharyngeal exudate present.  Nares are boggy  No sinus tenderness Throat -erythematous with tonsillar edema and some exudate-worse on the R  Eyes: Conjunctivae and EOM are normal. Pupils are equal, round, and reactive to light. Right eye exhibits no discharge. Left eye exhibits no discharge.  Neck: Normal range of motion. Neck supple.  Cardiovascular: Normal rate and regular rhythm.   Pulmonary/Chest: Effort normal and breath sounds normal. No respiratory distress. She has no wheezes. She has no rales.  Lymphadenopathy:    She  has no cervical adenopathy.  Neurological: She is alert.  Skin: Skin is warm and dry. No rash noted. No erythema.  Psychiatric: She has a normal mood and affect.          Assessment & Plan:   Problem List Items Addressed This Visit     Respiratory   Acute tonsillitis     Rapid strep neg  Enlarged tonsils with exudate and fever Cover with amoxicillin Update if not starting to improve in a week or if worsening  Disc symptomatic care - see instructions on AVS      Other Visit Diagnoses   Sore throat    -  Primary     Relevant Orders       Rapid Strep A (Completed)

## 2014-07-04 ENCOUNTER — Telehealth: Payer: Self-pay

## 2014-07-04 NOTE — Telephone Encounter (Signed)
Pt request lisinopril HCTZ transferred from [facility]. Advised pt to contact new pharmacy and they will get med transferred. Pt voiced understanding.

## 2014-09-08 IMAGING — CR DG CHEST 1V PORT
1 series · 1 of 1 positions shown · non-contrast
Comparison: none

REASON FOR EXAM: Shorthness of breath
COMMENTS:

[ap]
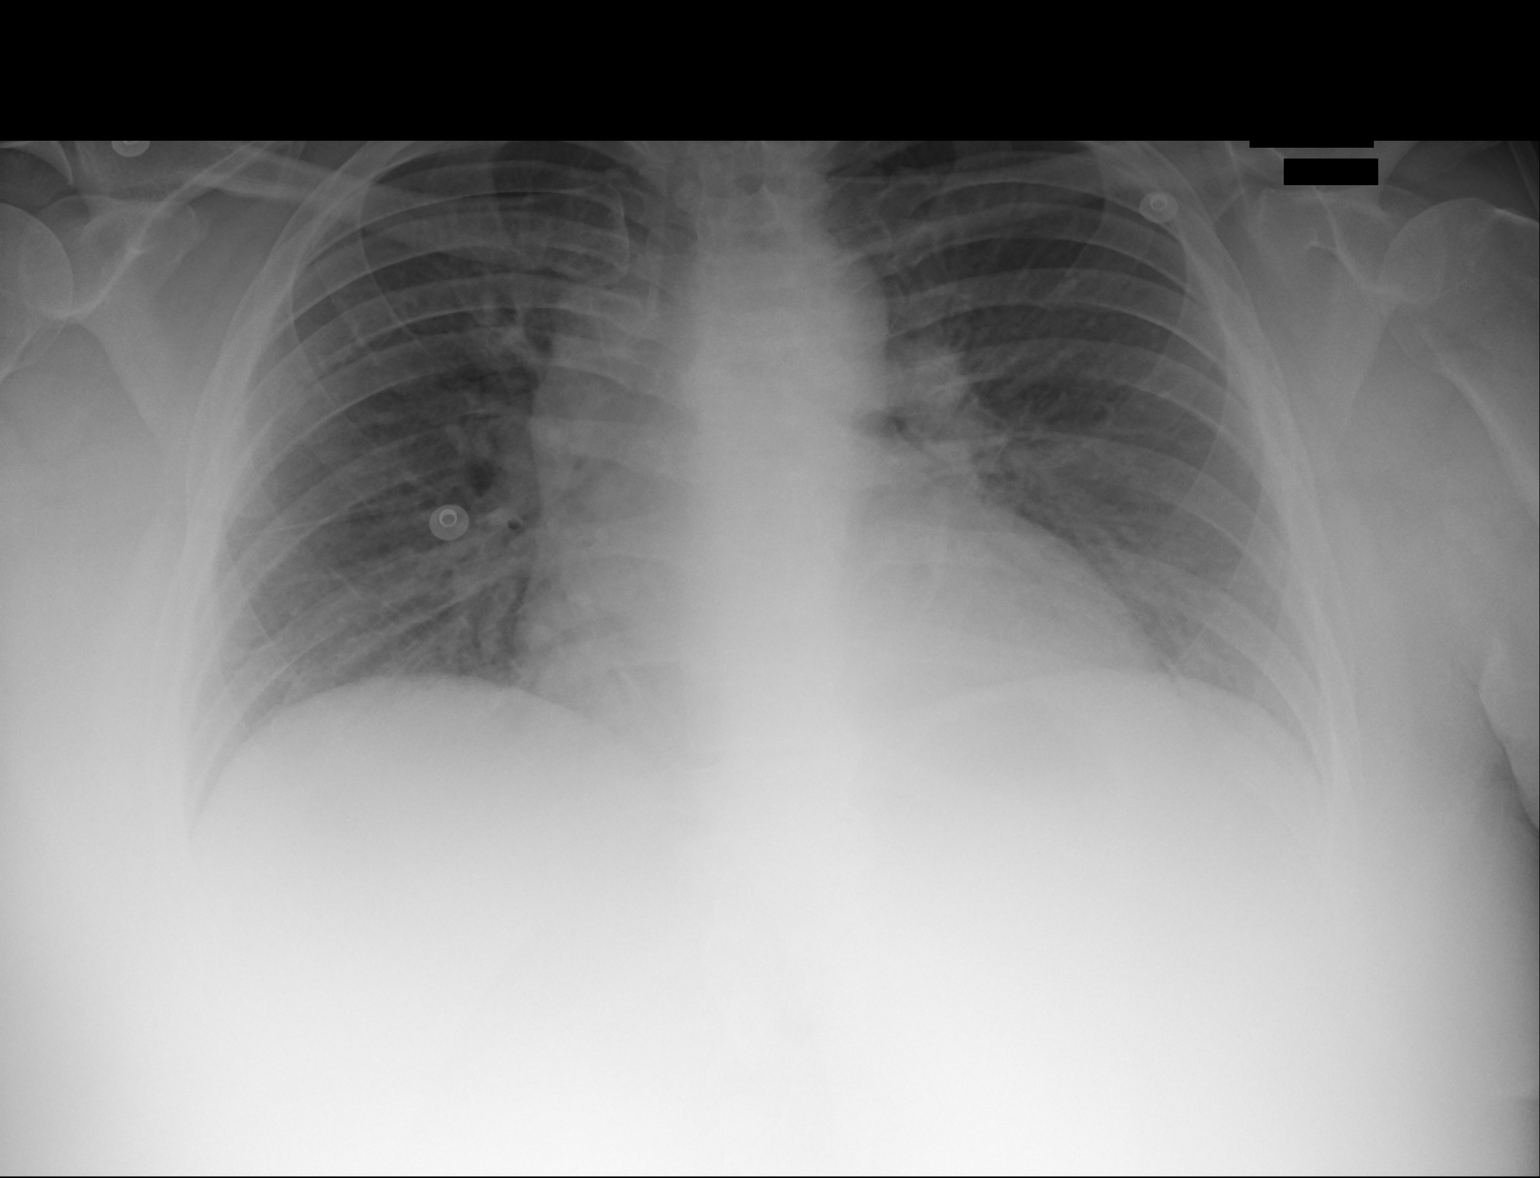

[1 of 1 positions shown; findings below may reference images not displayed]

PROCEDURE:     DXR - DXR PORTABLE CHEST SINGLE VIEW  - February 24, 2013  [DATE]

RESULT:     There is shallow inspiratory effort. Minimal right lung base
atelectasis is present. The lungs are otherwise clear. The heart and
pulmonary vessels are normal. The bony and mediastinal structures are
unremarkable. There is no effusion. There is no pneumothorax or evidence of
congestive failure.
IMPRESSION: Shallow inspiration with minimal right lung base
atelectasis.

[REDACTED]

## 2014-11-18 NOTE — Discharge Summary (Signed)
PATIENT NAME:  Sheila Hicks, Sheila Hicks MR#:  [id number] DATE OF BIRTH:  [birth year]  DATE OF ADMISSION:  02/24/2013 DATE OF DISCHARGE:  02/26/2013   DISCHARGE DIAGNOSES: 1. Dyspnea with hypoxia, hemoptysis and elevated brain natriuretic peptide, likely from flash pulmonary edema, ruled out for myocardial infarction. Has a normal 2-D echocardiogram. She is back to her baseline. She will require outpatient pulmonary evaluation. 2. False-positive blood culture, likely skin contaminant, no pneumonia or infection. 3. Acute diastolic heart failure with flash pulmonary edema, well compensated at this time, with 2-D echocardiogram showing ejection fraction of 40% to 45%.   SECONDARY DIAGNOSIS:  1. Obesity.   CONSULTATIONS:  1. Pulmonary, Dr. Raul Del.  2. Cardiology, Dr. Clayborn Bigness.   PROCEDURES AND RADIOLOGY:  Chest x-ray on 30th of July showed right lung base atelectasis.  Chest x-ray on the 31st of July showed no acute cardiopulmonary disease.  CT scan of the chest with contrast on 30th of July showed ground-glass opacities within the lung bases. No pulmonary embolism.  A 2-D echocardiogram on 30th of July showed EF of 40% to 45%. Mildly decreased global LV systolic function. Mildly dilated left atrium. Mild mitral and tricuspid regurgitation. Mild thickening of the anterior and posterior mitral valve leaflets. Mildly increased LV posterior wall thickness.   MAJOR LABORATORY PANEL:  Blood culture x2 grew coagulase-negative staph in anaerobic and aerobic bottles, which was thought to be skin contaminant. The rest of the blood cultures were negative.  Serum ANA was negative.  Angiotensin-converting enzyme was within normal limits with a value of 35.  Anti-Jo-1 antibody was negative.   HISTORY AND SHORT HOSPITAL COURSE: The patient is a 39 year old female with no significant medical problems, who was admitted for symptomatic hypoxia with dyspnea, hemoptysis and elevated BNP, thought to be secondary to  flash pulmonary edema. Please see Dr. Rinaldo Ratel dictated history and physical for further details. Pulmonary consultation was obtained with Dr. Raul Del, who recommended CT chest, which showed ground-glass opacities. Differential included possible nonspecific interstitial pneumonitis with connective tissue interstitial disease or hypersensitivity pneumonitis. The workup so far has been negative. Cardiology consultation was obtained with Dr. Clayborn Bigness for possible diastolic heart failure, who recommended 2-D echo, which was done, with results dictated above showing EF of 40% to 45%. The patient was doing much better and was close to baseline on 1st of August and is being discharged home in stable condition.  PERTINENT PHYSICAL EXAMINATION ON THE DAY OF DISCHARGE:  VITAL SIGNS: On the day of discharge, her vital signs are as follows: Temperature 97.9, heart rate 67 per minute, respirations 17 per minute, blood pressure 105/63 mmHg. She was saturating 96% on room air.  CARDIOVASCULAR: S1, S2 normal. No murmurs, rubs or gallop.  LUNGS: Clear to auscultation bilaterally. No wheezing, rales, rhonchi or crepitation.  ABDOMEN: Soft, benign.  NEUROLOGIC: Nonfocal examination.  All other physical examination remained at baseline.   DISCHARGE MEDICATIONS: None.   DISCHARGE DIET: Regular.   DISCHARGE ACTIVITY: As tolerated.   DISCHARGE INSTRUCTIONS AND FOLLOWUP: The patient was instructed to follow up with her primary care physician, Dr. Otilio Miu, in 1 to 2 weeks. She will need followup with pulmonary, Dr. Raul Del, in 2 to 4 weeks.  TOTAL TIME DISCHARGE THIS PATIENT: 45 minutes.   ____________________________ Lucina Mellow. Manuella Ghazi, MD vss:OSi D: 02/26/2013 11:34:38 ET T: 02/26/2013 12:07:27 ET JOB#: [id number]  cc: Narek Kniss S. Manuella Ghazi, MD, <Dictator> Juline Patch, MD Herbon E. Raul Del, MD Dwayne D. Clayborn Bigness, MD Remer Macho MD ELECTRONICALLY SIGNED 02/28/2013  16:52 

## 2014-11-18 NOTE — H&P (Signed)
PATIENT NAME:  Sheila Hicks, Sheila Hicks MR#:  [id number] DATE OF BIRTH:  17-Feb-1976  DATE OF ADMISSION:  02/24/2013  PRIMARY CARE PHYSICIAN:  Otilio Miu, MD  REFERRING PHYSICIAN:  Dr. Dahlia Client.   CHIEF COMPLAINT: Shortness of breath and frothy pinkish phlegm.   HISTORY OF PRESENT ILLNESS: The patient is a 39 year old obese Caucasian female with no past medical history, is presenting to the ER with sudden onset of shortness of breath, coughing up frothy phlegm, while she was swimming. The patient is reporting that she was swimming and suddenly she could not breathe and became short of breath. Denies any chest pain or lower extremity edema. Denies any weight gain either. She has been coughing pinkish frothy phlegm. The patient was brought into the ER by her friends and she was hypoxic with a pulse ox of 82%. Chest x-ray has a revealed a significant pulmonary edema. The patient was given Lasix 25 mg IV and BNP was elevated.  EKG did not reveal any acute ST-T wave changes. The patient was placed on 4 liters of oxygen initially and eventually it was cut down to 2 liters and the patient was sating 97% on 2 liters. Denies any cardiac history. Denies any chest pain. No loss of consciousness or dizziness. Resting comfortably.   PAST MEDICAL HISTORY: Obesity.   PAST SURGICAL HISTORY: None.   ALLERGIES: No known drug allergies.  MEDICATIONS: Not on any home medications.   PSYCHOSOCIAL HISTORY: Lives at home, lives alone. Denies any history of smoking, alcohol or illicit drug usage.   FAMILY HISTORY: Nothing significant. Mom and dad do not have any significant medical problems.  REVIEW OF SYSTEMS: CONSTITUTIONAL: Denies any fever, fatigue.  EYES: Denies blurred vision, glaucoma.  ENT: Denies tinnitus, epistaxis.  RESPIRATION: Complaining of any pinkish frothy phlegm during coughing. No history of chronic obstructive pulmonary disease.  CARDIOVASCULAR: No chest pain or dizziness.  GASTROINTESTINAL:   Denies nausea, vomiting, or diarrhea.  GENITOURINARY: No dysuria, hematuria.   GYNECOLOGIC AND BREASTS:  Denies breast mass or vaginal discharge.  ENDOCRINE: Denies polyuria, nocturia, thyroid problems.  HEMATOLOGIC AND LYMPHATIC: No anemia, easy bruising, bleeding.  INTEGUMENTARY: No acne, rash, lesions.  MUSCULOSKELETAL: No joint pain in the neck, back, shoulder.  NEUROLOGIC: No history of vertigo, ataxia, dementia.  PSYCHIATRIC: No ADD, OCD or bipolar disorder.   PHYSICAL EXAMINATION: VITAL SIGNS: Temperature 99.8, pulse 96, respirations 20, blood pressure 155/80, pulse ox 96% on 2 liters.  GENERAL APPEARANCE: Not in acute distress. Moderately built and obese.  HEENT: Normocephalic, atraumatic. Pupils are equal, reacting light and accommodation. No scleral icterus. No conjunctival injection. No sinus tenderness. No postnasal drip.  NECK: Supple. No thyromegaly. No lymphadenopathy. Range of motion is intact.  LUNGS: Positive rales bilaterally up to mid lung fields. Moderate air entry.  CARDIAC: S1, S2 normal. Regular rate and rhythm. No murmurs.  GASTROINTESTINAL: Soft, obese. Bowel sounds are positive in all 4 quadrants. Nontender, nondistended. No hepatosplenomegaly.  NEUROLOGIC: Awake, alert, oriented x3. Motor and sensory are grossly intact. Reflexes are 2+.  SKIN: Warm to touch. Normal turgor. No rashes. No lesions.  MUSCULOSKELETAL: No joint effusion, tenderness, erythema.  EXTREMITIES: No edema. No cyanosis. No clubbing.  PSYCHIATRIC: Normal mood and affect.   LABORATORY AND IMAGING STUDIES: CAT scan of the chest with contrast is ordered which is pending at this time. Chest x-ray revealed bilateral pulmonary edema with cardiomegaly. EKG: No acute ST-T wave changes. Glucose 102. BNP 330, BUN 13, creatinine 1.91, sodium 141, potassium 3.8, chloride 106,  CO2 32, anion gap 3, GFR greater than 60, serum osmolality 282, calcium 8.8. CK total 284, CPK-MB 3.2, troponin less than 0.02. TSH is  normal at 3.38. WBC is elevated at 11.8, hemoglobin 14.8, hematocrit 44.0, platelets 255.    ASSESSMENT AND PLAN:  A 39 year old Caucasian female presenting to the ER with a chief complaint of sudden onset of shortness of breath associated with frothy pink phlegm during coughing and hypoxic in the Emergency Room with leukocytosis. Chest x-ray showing pulmonary edema and elevated BNP.  Will be admitted with the following assessment and plan:   1.  Dyspnea with hypoxia, hemoptysis and elevated BNP probably from flash pulmonary edema. We will rule out acute myocardial infarction. Cannot rule out underlying infiltrate. Will admit her to telemetry. Will cycle cardiac biomarkers. Acute coronary syndrome protocol.  2. Possible Pneumonia /acute bronchitis : Empiric antibiotics with levofloxacin.  3.  CT chest is ordered with contrast, which is pending at this time. A 2-D echocardiogram is ordered to assess the left ventricular ejection fraction. Cardiology consult is placed to on-call cardiology, Dr. Clayborn Bigness.  4.  Other differential diagnosis is pulmonary embolism. Stat D-dimer is ordered. Rounding physician to follow up on the results.  5.  Obesity: The patient will benefited with diet and exercise once clinically stable. We will provide her gastrointestinal prophylaxis and deep vein thrombosis prophylaxis with Lovenox subcutaneous.   CODE STATUS: SHE IS FULL CODE.  Mom is her medical power of attorney. The diagnoses and plan of care was discussed in detail with the patient. She is aware of the plan.   Total time spent on admission is 50 minutes.    ____________________________ Nicholes Mango, MD ag:dp D: 02/24/2013 08:19:37 ET T: 02/24/2013 08:37:38 ET JOB#: [id number]  cc: Nicholes Mango, MD, <Dictator> Juline Patch, MD Nicholes Mango MD ELECTRONICALLY SIGNED 03/05/2013 7:25

## 2014-11-18 NOTE — Consult Note (Signed)
Brief Consult Note: Diagnosis: CHF/SOB/Obesity.   Patient was seen by consultant.   Consult note dictated.   Recommend further assessment or treatment.   Orders entered.   Discussed with Attending MD.   Comments: IMP CHF Obesity Pu ledema SOB OSA? HTN? . PLAN Tele Tox screen ECHO R/O PE ConsiderR/L cardiac cath Sleep study Wgt loss.  Electronic Signatures: Lujean Amel D (MD)  (Signed 31-Jul-14 07:57)  Authored: Brief Consult Note   Last Updated: 31-Jul-14 07:57 by Lujean Amel D (MD)

## 2015-03-28 ENCOUNTER — Encounter: Payer: No Typology Code available for payment source | Admitting: Family Medicine

## 2015-03-29 ENCOUNTER — Telehealth: Payer: Self-pay | Admitting: Family Medicine

## 2015-03-29 NOTE — Telephone Encounter (Signed)
Pt did not come in for their appt today for CPE. Please let me know if pt needs to be contacted immediately for follow up or no follow up needed. Best phone number to contact pt is [phone].

## 2015-07-17 ENCOUNTER — Other Ambulatory Visit: Payer: Self-pay | Admitting: Family Medicine

## 2015-08-09 ENCOUNTER — Other Ambulatory Visit: Payer: Self-pay | Admitting: Family Medicine

## 2016-02-29 ENCOUNTER — Ambulatory Visit: Payer: Self-pay | Admitting: Urology

## 2016-02-29 VITALS — BP 176/91 | HR 103 | Temp 98.4°F | Ht 68.0 in | Wt 274.0 lb

## 2016-02-29 DIAGNOSIS — I10 Essential (primary) hypertension: Secondary | ICD-10-CM

## 2016-02-29 MED ORDER — LISINOPRIL-HYDROCHLOROTHIAZIDE 10-12.5 MG PO TABS: 1.0000 | ORAL_TABLET | Freq: Every day | ORAL | 0 refills | Status: DC

## 2016-02-29 NOTE — Progress Notes (Signed)
  Patient: Sheila Hicks Female    DOB: May 06, 1976   40 y.o.   MRN: [id number] Visit Date: 02/29/2016  Today's Provider: Zara Council, PA-C   Chief Complaint  Patient presents with  . Medication Refill    Lisinopril-HCTZ   Subjective:    HPI   Patient has been without BP meds since end of June.  Was seen at urgent care and was given 7 pills to get her by.    No Known Allergies Previous Medications   AMOXICILLIN (AMOXIL) 500 MG CAPSULE    Take 1 capsule (500 mg total) by mouth 3 (three) times daily.   IBUPROFEN (ADVIL,MOTRIN) 200 MG TABLET    Take 4 mg by mouth every 8 (eight) hours as needed.  herbal supplements- fish oil , multi vitamins  Review of Systems  Social History  Substance Use Topics  . Smoking status: Never Smoker  . Smokeless tobacco: Not on file  . Alcohol use No   Objective:   BP (!) 176/91 (BP Location: Left Arm, Patient Position: Sitting, Cuff Size: Large)   Pulse (!) 103   Temp 98.4 F (36.9 C)   Ht 5\' 8"  (1.727 m)   Wt 274 lb (124.3 kg)   BMI 41.66 kg/m   Physical Exam      Assessment & Plan:     1. HTN  -bp meds refilled   -recheck bp in 2weeks  -check CMP, TSH, CBC, HbgA1c, lipids   -RTC in 2 to 4 weeks        Zara Council, PA-C   [facility]

## 2016-03-01 LAB — CBC WITH DIFFERENTIAL/PLATELET
BASOS ABS: 0 10*3/uL (ref 0.0–0.2)
Basos: 0 %
EOS (ABSOLUTE): 0.2 10*3/uL (ref 0.0–0.4)
Eos: 2 %
Hematocrit: 41.3 % (ref 34.0–46.6)
Hemoglobin: 13.9 g/dL (ref 11.1–15.9)
Immature Grans (Abs): 0 10*3/uL (ref 0.0–0.1)
Immature Granulocytes: 0 %
LYMPHS ABS: 2.8 10*3/uL (ref 0.7–3.1)
Lymphs: 29 %
MCH: 28.7 pg (ref 26.6–33.0)
MCHC: 33.7 g/dL (ref 31.5–35.7)
MCV: 85 fL (ref 79–97)
MONOCYTES: 9 %
MONOS ABS: 0.8 10*3/uL (ref 0.1–0.9)
Neutrophils Absolute: 5.9 10*3/uL (ref 1.4–7.0)
Neutrophils: 60 %
PLATELETS: 259 10*3/uL (ref 150–379)
RBC: 4.84 x10E6/uL (ref 3.77–5.28)
RDW: 15 % (ref 12.3–15.4)
WBC: 9.8 10*3/uL (ref 3.4–10.8)

## 2016-03-01 LAB — COMPREHENSIVE METABOLIC PANEL
ALK PHOS: 43 IU/L (ref 39–117)
ALT: 39 IU/L — ABNORMAL HIGH (ref 0–32)
AST: 50 IU/L — AB (ref 0–40)
Albumin/Globulin Ratio: 1.1 — ABNORMAL LOW (ref 1.2–2.2)
Albumin: 3.8 g/dL (ref 3.5–5.5)
BILIRUBIN TOTAL: 0.7 mg/dL (ref 0.0–1.2)
BUN/Creatinine Ratio: 18 (ref 9–23)
BUN: 20 mg/dL (ref 6–20)
CHLORIDE: 101 mmol/L (ref 96–106)
CO2: 22 mmol/L (ref 18–29)
CREATININE: 1.11 mg/dL — AB (ref 0.57–1.00)
Calcium: 9 mg/dL (ref 8.7–10.2)
GFR calc Af Amer: 73 mL/min/{1.73_m2} (ref >59)
GFR calc non Af Amer: 63 mL/min/{1.73_m2} (ref >59)
GLUCOSE: 127 mg/dL — AB (ref 65–99)
Globulin, Total: 3.4 g/dL (ref 1.5–4.5)
Potassium: 3.8 mmol/L (ref 3.5–5.2)
Sodium: 140 mmol/L (ref 134–144)
Total Protein: 7.2 g/dL (ref 6.0–8.5)

## 2016-03-01 LAB — LIPID PANEL
CHOL/HDL RATIO: 5.6 ratio — AB (ref 0.0–4.4)
Cholesterol, Total: 169 mg/dL (ref 100–199)
HDL: 30 mg/dL — ABNORMAL LOW (ref >39)
LDL CALC: 94 mg/dL (ref 0–99)
TRIGLYCERIDES: 225 mg/dL — AB (ref 0–149)
VLDL Cholesterol Cal: 45 mg/dL — ABNORMAL HIGH (ref 5–40)

## 2016-03-01 LAB — TSH: TSH: 1.14 u[IU]/mL (ref 0.450–4.500)

## 2016-03-01 LAB — HEMOGLOBIN A1C
ESTIMATED AVERAGE GLUCOSE: 126 mg/dL
HEMOGLOBIN A1C: 6 % — AB (ref 4.8–5.6)

## 2016-03-20 ENCOUNTER — Telehealth: Payer: Self-pay

## 2016-03-20 NOTE — Telephone Encounter (Signed)
Attempted to call patient to remind her to bring one more recent pay stub with her to her 03/21/16 appointment in order to be seen.  Discussed the need for the missing pay stub to be turned in at a previous appointment in order to be seen further and patient stated she would mail it to [place], however it hadn't been received at this time.  Called [phone], however the unable to reach patient and a message plays stating mailbox has not been set up yet.

## 2016-03-21 ENCOUNTER — Ambulatory Visit: Payer: Self-pay | Admitting: Family Medicine

## 2016-03-21 VITALS — BP 142/98 | HR 90 | Wt 240.0 lb

## 2016-03-21 DIAGNOSIS — R748 Abnormal levels of other serum enzymes: Secondary | ICD-10-CM

## 2016-03-21 DIAGNOSIS — R739 Hyperglycemia, unspecified: Secondary | ICD-10-CM

## 2016-03-21 DIAGNOSIS — I10 Essential (primary) hypertension: Secondary | ICD-10-CM

## 2016-03-21 NOTE — Patient Instructions (Signed)
Work on regular exercise and weight loss. Stay on BP pill and monitor (and write down BP readings). Avoid Tylenol and Motrin (and Aleve), if possible.

## 2016-03-21 NOTE — Progress Notes (Signed)
Subjective:     Patient ID: Sheila Hicks, female   DOB: [birth year], 40 y.o.   MRN: [id number]  HPI pt in for recheck of HTN.  Started lisinopril/HCT last visit. Has not checked BP since.  Breathing is better, not as much swelling in her legs.  No Cp. Urinates more on med.   Review of Systems     Objective:   Physical Exam A+O Conj clear RRR  CTA No edema    Assessment:    HTN    Plan:     HTN. Stay on lisnopril/HCT. Avoid NSAID's.  Recheck Met B. Elevated liver enzymes. Avoid Tylenol.  Recheck liver today. Elevated glucose and A1c. Probable early Dm. Work on weight loss and increased exercise. RTC 2 months

## 2016-03-26 ENCOUNTER — Other Ambulatory Visit: Payer: Self-pay

## 2016-05-16 ENCOUNTER — Ambulatory Visit: Payer: Self-pay | Admitting: Adult Health Nurse Practitioner

## 2016-05-16 VITALS — BP 141/89 | HR 87 | Temp 98.0°F | Wt 305.0 lb

## 2016-05-16 DIAGNOSIS — R7303 Prediabetes: Secondary | ICD-10-CM | POA: Insufficient documentation

## 2016-05-16 DIAGNOSIS — N939 Abnormal uterine and vaginal bleeding, unspecified: Secondary | ICD-10-CM | POA: Insufficient documentation

## 2016-05-16 DIAGNOSIS — IMO0001 Reserved for inherently not codable concepts without codable children: Secondary | ICD-10-CM

## 2016-05-16 LAB — GLUCOSE, POCT (MANUAL RESULT ENTRY): POC GLUCOSE: 105 mg/dL — AB (ref 70–99)

## 2016-05-16 MED ORDER — LISINOPRIL-HYDROCHLOROTHIAZIDE 20-12.5 MG PO TABS: 1.0000 | ORAL_TABLET | Freq: Every day | ORAL | 0 refills | Status: DC

## 2016-05-16 NOTE — Patient Instructions (Signed)
Stop current lisinopril dose-start lisinopril HCTZ 20-12.5.

## 2016-05-16 NOTE — Progress Notes (Signed)
  Patient: Sheila Hicks Female    DOB: [birth year]   40 y.o.   MRN: [id number] Visit Date: 05/16/2016  Today's Provider: [place]   Chief Complaint  Patient presents with  . Vaginal Bleeding    x3weeks tampon plus q4hrs   . Diabetes    glucose fu   Subjective:    HPI  VAGINAL BLEEDING:  Spotting at the beginning of the month.  On Depo- last injection on Oct 9.  Using large tampon every 30 minutes. States that it has lightened up.  Taking 800mg  Ibuprofen every 8 hours x 1 week.    PREDIABETES: CBG today 105.  A1C 6.0.  Reports monitoring diet.  Reports walking occasionally- but not currently.      No Known Allergies Previous Medications   AMOXICILLIN (AMOXIL) 500 MG CAPSULE    Take 1 capsule (500 mg total) by mouth 3 (three) times daily.   IBUPROFEN (ADVIL,MOTRIN) 200 MG TABLET    Take 4 mg by mouth every 8 (eight) hours as needed.   LISINOPRIL-HYDROCHLOROTHIAZIDE (PRINZIDE,ZESTORETIC) 10-12.5 MG TABLET    Take 1 tablet by mouth daily.    Review of Systems  All other systems reviewed and are negative.   Social History  Substance Use Topics  . Smoking status: Never Smoker  . Smokeless tobacco: Never Used  . Alcohol use No   Objective:   BP (!) 141/89   Pulse 87   Temp 98 F (36.7 C)   Wt (!) 305 lb (138.3 kg)   LMP 05/16/2016 (Exact Date)   SpO2 96%   BMI 46.38 kg/m   Physical Exam  Constitutional: She is oriented to person, place, and time. She appears well-developed and well-nourished.  HENT:  Head: Normocephalic and atraumatic.  Eyes: Pupils are equal, round, and reactive to light.  Neck: Normal range of motion. Neck supple.  Cardiovascular: Normal rate and regular rhythm.   Pulmonary/Chest: Effort normal and breath sounds normal.  Abdominal: Soft.  Musculoskeletal: Normal range of motion.  Neurological: She is alert and oriented to person, place, and time.  Skin: Skin is warm and dry.  Psychiatric: Her behavior is normal.         Assessment & Plan:      PREDIABETES: Will continue to monitor.  FU in 4 months for labs and FU DM and HTN.   VAGINAL BLEEDING:  FU with health dept tomorrow.   HTN:  Increase lisinopril-HCTZ to 20-12.5.  FU in 4 weeks for BP check.    Resume exercise and continue to monitor diet.    [facility]

## 2016-05-21 ENCOUNTER — Other Ambulatory Visit: Payer: Self-pay

## 2016-05-21 DIAGNOSIS — I10 Essential (primary) hypertension: Secondary | ICD-10-CM

## 2016-05-21 MED ORDER — LISINOPRIL-HYDROCHLOROTHIAZIDE 20-12.5 MG PO TABS: 1.0000 | ORAL_TABLET | Freq: Every day | ORAL | 0 refills | Status: DC

## 2016-05-22 DIAGNOSIS — Z8679 Personal history of other diseases of the circulatory system: Secondary | ICD-10-CM | POA: Insufficient documentation

## 2016-05-28 ENCOUNTER — Other Ambulatory Visit: Payer: Self-pay | Admitting: Urology

## 2016-06-28 ENCOUNTER — Emergency Department
Admission: EM | Admit: 2016-06-28 | Discharge: 2016-06-28 | Disposition: A | Discharge status: Home / Self Care | Payer: No Typology Code available for payment source | Attending: Emergency Medicine | Admitting: Emergency Medicine

## 2016-06-28 ENCOUNTER — Encounter: Payer: Self-pay | Admitting: Emergency Medicine

## 2016-06-28 DIAGNOSIS — Z791 Long term (current) use of non-steroidal anti-inflammatories (NSAID): Secondary | ICD-10-CM | POA: Insufficient documentation

## 2016-06-28 DIAGNOSIS — Z79899 Other long term (current) drug therapy: Secondary | ICD-10-CM | POA: Insufficient documentation

## 2016-06-28 DIAGNOSIS — N898 Other specified noninflammatory disorders of vagina: Secondary | ICD-10-CM | POA: Insufficient documentation

## 2016-06-28 DIAGNOSIS — I10 Essential (primary) hypertension: Secondary | ICD-10-CM | POA: Insufficient documentation

## 2016-06-28 DIAGNOSIS — N939 Abnormal uterine and vaginal bleeding, unspecified: Secondary | ICD-10-CM | POA: Insufficient documentation

## 2016-06-28 LAB — CBC WITH DIFFERENTIAL/PLATELET
Basophils Absolute: 0.1 10*3/uL (ref 0–0.1)
Basophils Relative: 1 %
Eosinophils Absolute: 0.2 10*3/uL (ref 0–0.7)
Eosinophils Relative: 2 %
HCT: 32.6 % — ABNORMAL LOW (ref 35.0–47.0)
Hemoglobin: 10.8 g/dL — ABNORMAL LOW (ref 12.0–16.0)
Lymphocytes Relative: 24 %
Lymphs Abs: 2.5 10*3/uL (ref 1.0–3.6)
MCH: 28.2 pg (ref 26.0–34.0)
MCHC: 33.2 g/dL (ref 32.0–36.0)
MCV: 85 fL (ref 80.0–100.0)
Monocytes Absolute: 0.5 10*3/uL (ref 0.2–0.9)
Monocytes Relative: 5 %
Neutro Abs: 6.9 10*3/uL — ABNORMAL HIGH (ref 1.4–6.5)
Neutrophils Relative %: 68 %
Platelets: 242 10*3/uL (ref 150–440)
RBC: 3.84 MIL/uL (ref 3.80–5.20)
RDW: 14.7 % — ABNORMAL HIGH (ref 11.5–14.5)
WBC: 10.1 10*3/uL (ref 3.6–11.0)

## 2016-06-28 LAB — POCT PREGNANCY, URINE: Preg Test, Ur: NEGATIVE

## 2016-06-28 LAB — ABO/RH: ABO/RH(D): AB NEG

## 2016-06-28 LAB — HCG, QUANTITATIVE, PREGNANCY

## 2016-06-28 NOTE — ED Provider Notes (Signed)
[facility] Emergency Department Provider Note  ____________________________________________   I have reviewed the triage vital signs and the nursing notes.   HISTORY  Chief Complaint Miscarriage    HPI Sheila Hicks is a 40 y.o. female with a history of abnormal vaginal bleeding, on Depo-Provera, no anticoagulation no easy bleeding or bruisability otherwise, had a heavy period in October for the first time in some time, which was shortly after her last Depo-Provera shot, and then has had heavy bleeding for the last few days. She denies any fever or chills. She does not believe that she is pregnant however she did pass a blood clot that looked unusual to her and she wants to make sure she is not pregnant.   She is not lightheaded, she has no other symptoms.   Past Medical History:  Diagnosis Date  . Arrhythmia   . Heart murmur    LBBB  . History of depression   . History of hay fever   . History of hypertension   . Hypertension     Patient Active Problem List   Diagnosis Date Noted  . Prediabetes 05/16/2016  . Vaginal bleeding 05/16/2016  . Acute tonsillitis 04/06/2014  . Low HDL (under 40) 03/21/2014  . Essential hypertension, benign 02/21/2014  . Obesity 02/21/2014  . History of depression 02/21/2014    History reviewed. No pertinent surgical history.  Prior to Admission medications   Medication Sig Start Date End Date Taking? Authorizing Provider  amoxicillin (AMOXIL) 500 MG capsule Take 1 capsule (500 mg total) by mouth 3 (three) times daily. Patient not taking: Reported on 05/16/2016 04/06/14   Abner Greenspan, MD  ibuprofen (ADVIL,MOTRIN) 200 MG tablet Take 4 mg by mouth every 8 (eight) hours as needed.    Historical Provider, MD  lisinopril-hydrochlorothiazide (ZESTORETIC) 20-12.5 MG tablet Take 1 tablet by mouth daily. 05/21/16   Nori Riis, PA-C    Allergies Patient has no known allergies.  Family History  Problem  Relation Age of Onset  . Hypertension Father   . Hypertension Mother   . Diabetes Mother   . Hypertension Maternal Grandmother   . Diabetes Maternal Grandmother     Social History Social History  Substance Use Topics  . Smoking status: Never Smoker  . Smokeless tobacco: Never Used  . Alcohol use No    Review of Systems Constitutional: No fever/chills Eyes: No visual changes. ENT: No sore throat. No stiff neck no neck pain Cardiovascular: Denies chest pain. Respiratory: Denies shortness of breath. Gastrointestinal:   no vomiting.  No diarrhea.  No constipation. Genitourinary: Negative for dysuria. Musculoskeletal: Negative lower extremity swelling Skin: Negative for rash. Neurological: Negative for severe headaches, focal weakness or numbness. 10-point ROS otherwise negative.  ____________________________________________   PHYSICAL EXAM:  VITAL SIGNS: ED Triage Vitals  Enc Vitals Group     BP 06/28/16 1031 (!) 156/91     Pulse Rate 06/28/16 1031 92     Resp 06/28/16 1031 16     Temp 06/28/16 1031 98.6 F (37 C)     Temp Source 06/28/16 1031 Oral     SpO2 06/28/16 1031 100 %     Weight 06/28/16 1032 290 lb (131.5 kg)     Height 06/28/16 1032 5\' 8"  (1.727 m)     Head Circumference --      Peak Flow --      Pain Score --      Pain Loc --  Pain Edu? --      Excl. in [place]? --     Constitutional: Alert and oriented. Well appearing and in no acute distress. Eyes: Conjunctivae are normal. PERRL. EOMI. Head: Atraumatic. Nose: No congestion/rhinnorhea. Mouth/Throat: Mucous membranes are moist.  Oropharynx non-erythematous. Neck: No stridor.   Nontender with no meningismus Cardiovascular: Normal rate, regular rhythm. Grossly normal heart sounds.  Good peripheral circulation. Respiratory: Normal respiratory effort.  No retractions. Lungs CTAB. Abdominal: Soft and nontender. No distention. No guarding no rebound Back:  There is no focal tenderness or step off.   there is no midline tenderness there are no lesions noted. there is no CVA tenderness GU: Patient declines pelvic exam Musculoskeletal: No lower extremity tenderness, no upper extremity tenderness. No joint effusions, no DVT signs strong distal pulses no edema Neurologic:  Normal speech and language. No gross focal neurologic deficits are appreciated.  Skin:  Skin is warm, dry and intact. No rash noted. Psychiatric: Mood and affect are normal. Speech and behavior are normal.  ____________________________________________   LABS (all labs ordered are listed, but only abnormal results are displayed)  Labs Reviewed  CBC WITH DIFFERENTIAL/PLATELET - Abnormal; Notable for the following:       Result Value   Hemoglobin 10.8 (*)    HCT 32.6 (*)    RDW 14.7 (*)    Neutro Abs 6.9 (*)    All other components within normal limits  HCG, QUANTITATIVE, PREGNANCY  POC URINE PREG, ED  POCT PREGNANCY, URINE  ABO/RH   ____________________________________________  EKG  I personally interpreted any EKGs ordered by me or triage  ____________________________________________  RADIOLOGY  I reviewed any imaging ordered by me or triage that were performed during my shift and, if possible, patient and/or family made aware of any abnormal findings. ____________________________________________   PROCEDURES  Procedure(s) performed: None  Procedures  Critical Care performed: None  ____________________________________________   INITIAL IMPRESSION / ASSESSMENT AND PLAN / ED COURSE  Pertinent labs & imaging results that were available during my care of the patient were reviewed by me and considered in my medical decision making (see chart for details).  Patient with heavy vaginal bleeding, hemoglobin and vital signs are reassuring. No evidence of significant blood loss. I have offered her pelvic exam for further evaluation of this and she refuses. This is not unreasonable but it does limit my  ability to work her up. Her platelet count is reassuring. Her Quant is negative and her abdomen is benign. Extensive return precautions and follow-up given and understood. Patient will follow closely with OB and come back if she has anymore symptoms of heavy bleeding or lightheadedness etc.  Clinical Course    ____________________________________________   FINAL CLINICAL IMPRESSION(S) / ED DIAGNOSES  Final diagnoses:  Abnormal vaginal bleeding      This chart was dictated using voice recognition software.  Despite best efforts to proofread,  errors can occur which can change meaning.      Schuyler Amor, MD 06/28/16 1556

## 2016-06-28 NOTE — ED Notes (Signed)
Patient was advised during triage not to eat or drink anything.  Verbalized understanding.

## 2016-06-28 NOTE — ED Notes (Signed)
Pt to ed with c/o abd pain and heavy vaginal bleeding.  Pt states "I might be pregnant, but I am not sure, I want to make sure I am not having a miscarriage."  Pt states had bleeding for 1 1/2 weeks and then started again this week.

## 2016-06-28 NOTE — Discharge Instructions (Signed)
You have declined pelvic exam in the emergency department. This is certainly your choice but it does cause [place] to emphasize that if you have increased bleeding, a pad an hour or more, lightheadedness, or you feel worse in any way that you return to the emergency department. Do not fail to follow up with OB.

## 2016-06-28 NOTE — ED Triage Notes (Signed)
Vaginal bleeding X 2 weeks, constant bright red.  Using pads and tampons - estimates using 11-12 pads per day.  Last night she states she passed a "really big clot with some kind of bluish-gray tissue".  Hx of miscarriage X 2 previously.  States she feels like that is what is happening now.  Depo-Provera received the first week of Oct.  Does not have periods usually.

## 2016-07-04 ENCOUNTER — Ambulatory Visit: Payer: Self-pay | Admitting: Adult Health Nurse Practitioner

## 2016-07-04 VITALS — BP 123/76 | HR 97

## 2016-07-04 DIAGNOSIS — D250 Submucous leiomyoma of uterus: Secondary | ICD-10-CM

## 2016-07-04 DIAGNOSIS — N926 Irregular menstruation, unspecified: Secondary | ICD-10-CM

## 2016-07-04 DIAGNOSIS — D251 Intramural leiomyoma of uterus: Principal | ICD-10-CM

## 2016-07-04 NOTE — Patient Instructions (Signed)
Needs gynecology referral for excessive bleeding and cramping

## 2016-08-18 ENCOUNTER — Other Ambulatory Visit: Payer: Self-pay | Admitting: Urology

## 2016-08-18 DIAGNOSIS — I10 Essential (primary) hypertension: Secondary | ICD-10-CM

## 2016-08-21 ENCOUNTER — Other Ambulatory Visit: Payer: Self-pay

## 2016-08-21 ENCOUNTER — Telehealth: Payer: Self-pay

## 2016-08-21 DIAGNOSIS — I10 Essential (primary) hypertension: Secondary | ICD-10-CM

## 2016-08-21 MED ORDER — LISINOPRIL-HYDROCHLOROTHIAZIDE 20-12.5 MG PO TABS: 1.0000 | ORAL_TABLET | Freq: Every day | ORAL | 0 refills | Status: DC

## 2016-08-21 NOTE — Telephone Encounter (Signed)
Called pt about referral to gynecology from December. Pt stated she was having some bleeding from oct to the end of dec. She is going to the health dept Friday for a physical and will call me back let me know if she wants me to send in the referral since she quit bleeding she does not see why she needs to go but after her appt Friday will know for sure if she has any problems that she does need to be seen for.

## 2016-08-23 LAB — HM HIV SCREENING LAB: HM HIV Screening: NEGATIVE

## 2016-09-12 ENCOUNTER — Ambulatory Visit: Payer: Self-pay | Admitting: Urology

## 2016-09-12 VITALS — Temp 99.6°F | Wt 309.0 lb

## 2016-09-12 DIAGNOSIS — IMO0001 Reserved for inherently not codable concepts without codable children: Secondary | ICD-10-CM

## 2016-09-12 DIAGNOSIS — R053 Chronic cough: Secondary | ICD-10-CM

## 2016-09-12 DIAGNOSIS — R05 Cough: Secondary | ICD-10-CM

## 2016-09-12 MED ORDER — FLUTICASONE PROPIONATE 50 MCG/ACT NA SUSP: 2.0000 | Freq: Every day | NASAL | 2 refills | Status: DC

## 2016-09-12 NOTE — Progress Notes (Addendum)
  Patient: Sheila Hicks Female    DOB: 06-11-1976   41 y.o.   MRN: [id number] Visit Date: 09/12/2016  Today's Provider: [place]   Chief Complaint  Patient presents with  . Cough    clear phlegm, 3-4 months, worse at night   Subjective:    HPI 41 yo WF with a 4 months of productive cough worsening at night.  She has not had any fevers, chills, nausea or vomiting.  She has not had SOB.  She has not had reflux.  Non smoking.  Coughing wakes her up at night.  She has had rhinorrhea.  Both the sinus drainage and the coughing produce a clear drainage.    She did have a hospitalization for CHF in 2014, but she states she was told it was mild.      No Known Allergies Previous Medications   AMOXICILLIN (AMOXIL) 500 MG CAPSULE    Take 1 capsule (500 mg total) by mouth 3 (three) times daily.   IBUPROFEN (ADVIL,MOTRIN) 200 MG TABLET    Take 4 mg by mouth every 8 (eight) hours as needed.   LISINOPRIL-HYDROCHLOROTHIAZIDE (ZESTORETIC) 20-12.5 MG TABLET    Take 1 tablet by mouth daily.    Review of Systems  Social History  Substance Use Topics  . Smoking status: Never Smoker  . Smokeless tobacco: Never Used  . Alcohol use No   Objective:   Temp 99.6 F (37.6 C)   Wt (!) 309 lb (140.2 kg)   BMI 46.98 kg/m   Physical Exam Constitutional: Well nourished. Alert and oriented, No acute distress. HEENT: [place] AT, moist mucus membranes. Trachea midline, no masses. Cardiovascular: No clubbing, cyanosis, or edema. Respiratory: Normal respiratory effort, no increased work of breathing. GI: Abdomen is soft, non tender, non distended, no abdominal masses. Liver and spleen not palpable.  No hernias appreciated.  Stool sample for occult testing is not indicated.   GU: No CVA tenderness.  No bladder fullness or masses.   Skin: No rashes, bruises or suspicious lesions. Lymph: No cervical or inguinal adenopathy. Neurologic: Grossly intact, no focal deficits, moving all 4  extremities. Psychiatric: Normal mood and affect.      Assessment & Plan:    1. Chronic cough  - chest x -ray - consider cardiac referral pending results  - may be due to sinus drainage - start Flonase  - put on lisinopril for two years - patient states that this is a different cough from the cough she had when she first started the medication  2. Obesity  - exercising  - eating good foods  - is noting an increase in appetite after exercise       [facility]

## 2016-10-10 ENCOUNTER — Ambulatory Visit: Payer: Self-pay

## 2016-10-17 ENCOUNTER — Ambulatory Visit: Payer: Self-pay | Admitting: Nurse Practitioner

## 2016-10-17 VITALS — BP 134/64 | HR 96 | Temp 98.3°F | Wt 316.2 lb

## 2016-10-17 DIAGNOSIS — I10 Essential (primary) hypertension: Secondary | ICD-10-CM

## 2016-10-17 DIAGNOSIS — N920 Excessive and frequent menstruation with regular cycle: Secondary | ICD-10-CM

## 2016-10-17 DIAGNOSIS — R7309 Other abnormal glucose: Secondary | ICD-10-CM

## 2016-10-17 DIAGNOSIS — E782 Mixed hyperlipidemia: Secondary | ICD-10-CM

## 2016-10-17 MED ORDER — LISINOPRIL-HYDROCHLOROTHIAZIDE 20-12.5 MG PO TABS: 2.0000 | ORAL_TABLET | Freq: Every day | ORAL | 0 refills | Status: DC

## 2016-10-17 NOTE — Progress Notes (Addendum)
41 year old caucasian female  Concern, regarding wheezing when lying down at night, has used netti pot, with resolution of symptoms  Hospitalization in December for severe menses, and cramping  Is on depo provera, thru [facility], has been on mirena, seasonique, patch, and nuva ring  Depo does not increase her bp  Positive for family history of dm  EXAM:   DEFERRED FOR NEXT VISIT   PLAN:  WILL DRAW HER MAINTENANCE LABS TONIGHT AND SEE HER BACK IN CLINIC IN 7-10 DAYS TO REVIEW FINDINGS  SYSTOLIC BP STAYING IN THE 130'S AND PT STATES HAS TYPICALLY BEEN HIGHER, WILL INCREASE TO LISINOPRIL TO BID OR 2 PER DAY , WILL SEE IN ONE WEEK

## 2016-10-18 LAB — CBC WITH DIFFERENTIAL/PLATELET
BASOS ABS: 0 10*3/uL (ref 0.0–0.2)
BASOS: 0 %
EOS (ABSOLUTE): 0.2 10*3/uL (ref 0.0–0.4)
Eos: 2 %
Hematocrit: 35.8 % (ref 34.0–46.6)
Hemoglobin: 10.9 g/dL — ABNORMAL LOW (ref 11.1–15.9)
IMMATURE GRANS (ABS): 0 10*3/uL (ref 0.0–0.1)
Immature Granulocytes: 0 %
LYMPHS ABS: 2.9 10*3/uL (ref 0.7–3.1)
LYMPHS: 28 %
MCH: 22.7 pg — AB (ref 26.6–33.0)
MCHC: 30.4 g/dL — AB (ref 31.5–35.7)
MCV: 74 fL — AB (ref 79–97)
MONOS ABS: 0.6 10*3/uL (ref 0.1–0.9)
Monocytes: 6 %
NEUTROS ABS: 6.4 10*3/uL (ref 1.4–7.0)
Neutrophils: 64 %
Platelets: 301 10*3/uL (ref 150–379)
RBC: 4.81 x10E6/uL (ref 3.77–5.28)
RDW: 18.1 % — AB (ref 12.3–15.4)
WBC: 10.2 10*3/uL (ref 3.4–10.8)

## 2016-10-18 LAB — HEMOGLOBIN A1C
Est. average glucose Bld gHb Est-mCnc: 148 mg/dL
HEMOGLOBIN A1C: 6.8 % — AB (ref 4.8–5.6)

## 2016-10-18 LAB — COMPREHENSIVE METABOLIC PANEL
ALBUMIN: 3.8 g/dL (ref 3.5–5.5)
ALK PHOS: 43 IU/L (ref 39–117)
ALT: 15 IU/L (ref 0–32)
AST: 14 IU/L (ref 0–40)
Albumin/Globulin Ratio: 1.2 (ref 1.2–2.2)
BILIRUBIN TOTAL: 0.6 mg/dL (ref 0.0–1.2)
BUN/Creatinine Ratio: 16 (ref 9–23)
BUN: 15 mg/dL (ref 6–20)
CO2: 25 mmol/L (ref 18–29)
CREATININE: 0.93 mg/dL (ref 0.57–1.00)
Calcium: 8.8 mg/dL (ref 8.7–10.2)
Chloride: 98 mmol/L (ref 96–106)
GFR calc non Af Amer: 78 mL/min/{1.73_m2} (ref >59)
GFR, EST AFRICAN AMERICAN: 90 mL/min/{1.73_m2} (ref >59)
GLOBULIN, TOTAL: 3.3 g/dL (ref 1.5–4.5)
Glucose: 185 mg/dL — ABNORMAL HIGH (ref 65–99)
Potassium: 4.4 mmol/L (ref 3.5–5.2)
SODIUM: 139 mmol/L (ref 134–144)
TOTAL PROTEIN: 7.1 g/dL (ref 6.0–8.5)

## 2016-10-18 LAB — LIPID PANEL
CHOLESTEROL TOTAL: 153 mg/dL (ref 100–199)
Chol/HDL Ratio: 5.3 ratio units — ABNORMAL HIGH (ref 0.0–4.4)
HDL: 29 mg/dL — ABNORMAL LOW (ref >39)
LDL CALC: 77 mg/dL (ref 0–99)
Triglycerides: 234 mg/dL — ABNORMAL HIGH (ref 0–149)
VLDL Cholesterol Cal: 47 mg/dL — ABNORMAL HIGH (ref 5–40)

## 2016-10-18 LAB — TSH: TSH: 1.17 u[IU]/mL (ref 0.450–4.500)

## 2016-10-18 LAB — FERRITIN: Ferritin: 14 ng/mL — ABNORMAL LOW (ref 15–150)

## 2016-10-24 ENCOUNTER — Ambulatory Visit: Payer: Self-pay | Admitting: Nurse Practitioner

## 2016-10-24 VITALS — BP 129/87 | HR 97 | Temp 99.4°F | Wt 309.7 lb

## 2016-10-24 DIAGNOSIS — R7303 Prediabetes: Secondary | ICD-10-CM

## 2016-10-24 DIAGNOSIS — D500 Iron deficiency anemia secondary to blood loss (chronic): Secondary | ICD-10-CM

## 2016-10-24 DIAGNOSIS — E119 Type 2 diabetes mellitus without complications: Secondary | ICD-10-CM

## 2016-10-24 LAB — GLUCOSE, POCT (MANUAL RESULT ENTRY): POC Glucose: 90 mg/dl (ref 70–99)

## 2016-10-24 MED ORDER — METFORMIN HCL 500 MG PO TABS: 500.0000 mg | ORAL_TABLET | Freq: Two times a day (BID) | ORAL | 3 refills | Status: DC

## 2016-10-24 NOTE — Progress Notes (Signed)
Here today for review of her labs, A1c, at 6.8, verifying early diabetes.  Pt has made some dramatic lifestyle changes, down 5 pounds this visit     EXAM:  DEFERRED, DONE AT LAST VISIT.   IMPRESSION/PLAN:  WILL START WITH METFORMIN 500 MG BID, AND RECHECK A1C IN 3 MONTHS  WILL ALSO A CBC WITHOU DIFF

## 2016-11-21 ENCOUNTER — Other Ambulatory Visit: Payer: Self-pay | Admitting: Urology

## 2016-11-21 DIAGNOSIS — I10 Essential (primary) hypertension: Secondary | ICD-10-CM

## 2017-01-23 ENCOUNTER — Telehealth: Payer: Self-pay

## 2017-01-23 ENCOUNTER — Ambulatory Visit: Payer: Self-pay

## 2017-01-23 NOTE — Telephone Encounter (Signed)
Patient left message needing to reschedule appt. Was unable to leave message. Mailbox full.

## 2017-02-13 ENCOUNTER — Ambulatory Visit: Payer: Self-pay | Admitting: Adult Health Nurse Practitioner

## 2017-02-13 VITALS — BP 116/73 | HR 88 | Wt 312.0 lb

## 2017-02-13 DIAGNOSIS — R7303 Prediabetes: Secondary | ICD-10-CM

## 2017-02-13 DIAGNOSIS — I10 Essential (primary) hypertension: Secondary | ICD-10-CM

## 2017-02-13 MED ORDER — LISINOPRIL-HYDROCHLOROTHIAZIDE 20-12.5 MG PO TABS: 2.0000 | ORAL_TABLET | Freq: Every day | ORAL | 1 refills | Status: DC

## 2017-02-13 NOTE — Progress Notes (Signed)
  Patient: Sheila Hicks Female    DOB: [birth year]   41 y.o.   MRN: [id number] Visit Date: 02/13/2017  Today's Provider: Staci Acosta, NP   Chief Complaint  Patient presents with  . Vaginal Bleeding   Subjective:    HPI  A1C 6.8 in March 2018.  Started on Metformin 500mg  BID.  CBGs running 93-140, she takes them weekly.  Reports one episode of hypoglycemia.   Pt recently joined [employer].  Lost about 9 lbs.     No Known Allergies Previous Medications   AMOXICILLIN (AMOXIL) 500 MG CAPSULE    Take 1 capsule (500 mg total) by mouth 3 (three) times daily.   FLUTICASONE (FLONASE) 50 MCG/ACT NASAL SPRAY    Place 2 sprays into both nostrils daily.   IBUPROFEN (ADVIL,MOTRIN) 200 MG TABLET    Take 4 mg by mouth every 8 (eight) hours as needed.   LISINOPRIL-HYDROCHLOROTHIAZIDE (PRINZIDE,ZESTORETIC) 20-12.5 MG TABLET    TAKE 1 TABLET BY MOUTH DAILY.   LISINOPRIL-HYDROCHLOROTHIAZIDE (ZESTORETIC) 20-12.5 MG TABLET    Take 2 tablets by mouth daily.   METFORMIN (GLUCOPHAGE) 500 MG TABLET    Take 1 tablet (500 mg total) by mouth 2 (two) times daily with a meal.    Review of Systems  All other systems reviewed and are negative.   Social History  Substance Use Topics  . Smoking status: Never Smoker  . Smokeless tobacco: Never Used  . Alcohol use No   Objective:   BP 116/73   Pulse 88   Wt (!) 312 lb (141.5 kg)   BMI 47.44 kg/m   Physical Exam  Constitutional: She is oriented to person, place, and time. She appears well-developed and well-nourished.  Cardiovascular: Normal rate, regular rhythm and normal heart sounds.   Pulmonary/Chest: Effort normal and breath sounds normal.  Abdominal: Soft. Bowel sounds are normal.  Neurological: She is alert and oriented to person, place, and time.  Skin: Skin is warm and dry.  Vitals reviewed.       Assessment & Plan:         DM:  A1c today.  Controlled.  Encourage diabetic diet and exercise.  Continue current medication  regimen.  May decrease Metformin to once daily pending A1c results.   HTN:  Controlled. .  Goal BP <140/90.  Continue current medication regimen.  Encourage low salt diet and exercise.     Staci Acosta, NP   [facility]

## 2017-02-14 LAB — COMPREHENSIVE METABOLIC PANEL
ALBUMIN: 4.3 g/dL (ref 3.5–5.5)
ALT: 20 IU/L (ref 0–32)
AST: 17 IU/L (ref 0–40)
Albumin/Globulin Ratio: 1.4 (ref 1.2–2.2)
Alkaline Phosphatase: 36 IU/L — ABNORMAL LOW (ref 39–117)
BUN/Creatinine Ratio: 14 (ref 9–23)
BUN: 16 mg/dL (ref 6–20)
Bilirubin Total: 0.9 mg/dL (ref 0.0–1.2)
CALCIUM: 9.4 mg/dL (ref 8.7–10.2)
CO2: 21 mmol/L (ref 20–29)
CREATININE: 1.14 mg/dL — AB (ref 0.57–1.00)
Chloride: 101 mmol/L (ref 96–106)
GFR, EST AFRICAN AMERICAN: 70 mL/min/{1.73_m2} (ref >59)
GFR, EST NON AFRICAN AMERICAN: 61 mL/min/{1.73_m2} (ref >59)
GLUCOSE: 99 mg/dL (ref 65–99)
Globulin, Total: 3.1 g/dL (ref 1.5–4.5)
Potassium: 4.1 mmol/L (ref 3.5–5.2)
Sodium: 138 mmol/L (ref 134–144)
TOTAL PROTEIN: 7.4 g/dL (ref 6.0–8.5)

## 2017-02-14 LAB — HEMOGLOBIN A1C
Est. average glucose Bld gHb Est-mCnc: 137 mg/dL
Hgb A1c MFr Bld: 6.4 % — ABNORMAL HIGH (ref 4.8–5.6)

## 2017-03-04 ENCOUNTER — Other Ambulatory Visit: Payer: Self-pay | Admitting: Urology

## 2017-03-04 DIAGNOSIS — I10 Essential (primary) hypertension: Secondary | ICD-10-CM

## 2017-06-03 ENCOUNTER — Other Ambulatory Visit: Payer: Self-pay

## 2017-06-10 ENCOUNTER — Ambulatory Visit: Payer: Self-pay | Admitting: Family Medicine

## 2017-06-10 DIAGNOSIS — I10 Essential (primary) hypertension: Secondary | ICD-10-CM

## 2017-06-10 DIAGNOSIS — R7303 Prediabetes: Secondary | ICD-10-CM

## 2017-06-10 NOTE — Progress Notes (Signed)
BP 117/67 (BP Location: Right Arm)   Pulse 91   Temp 98.1 F (36.7 C)   Ht '5\' 8"'  (1.727 m)   Wt (!) 304 lb 3.2 oz (138 kg)   BMI 46.25 kg/m    Subjective:    Patient ID: Sheila Hicks, female    DOB: 1976-05-27, 41 y.o.   MRN: [id number]  HPI: Sheila Hicks is a 41 y.o. female  Chief Complaint  Patient presents with  . Follow-up  . Nasal Congestion  . Back Pain  patient all in all doing very well on prediabetes patient doing well taking 1 metformin a day has lost significant amount of weight approximately 40+ pounds. Has been doing better with exercise also and feeling better. Of note over the last week patient's developed a head cold with nasal congestion feeling bad drainage also some low-grade fever and both ears bothersome.  Relevant past medical, surgical, family and social history reviewed and updated as indicated. Interim medical history since our last visit reviewed. Allergies and medications reviewed and updated.  Review of Systems  Constitutional: Negative.   Respiratory: Negative.   Cardiovascular: Negative.     Per HPI unless specifically indicated above     Objective:    BP 117/67 (BP Location: Right Arm)   Pulse 91   Temp 98.1 F (36.7 C)   Ht '5\' 8"'  (1.727 m)   Wt (!) 304 lb 3.2 oz (138 kg)   BMI 46.25 kg/m   Wt Readings from Last 3 Encounters:  06/10/17 (!) 304 lb 3.2 oz (138 kg)  02/13/17 (!) 312 lb (141.5 kg)  10/24/16 (!) 309 lb 11.2 oz (140.5 kg)    Physical Exam  Constitutional: She is oriented to person, place, and time. She appears well-developed and well-nourished.  HENT:  Head: Normocephalic and atraumatic.  Right Ear: External ear normal.  Left Ear: External ear normal.  Mouth/Throat: Oropharynx is clear and moist.  Eyes: Conjunctivae and EOM are normal.  Neck: Normal range of motion.  Cardiovascular: Normal rate, regular rhythm and normal heart sounds.  Pulmonary/Chest: Effort normal and breath sounds normal.   Musculoskeletal: Normal range of motion.  Neurological: She is alert and oriented to person, place, and time.  Skin: No erythema.  Psychiatric: She has a normal mood and affect. Her behavior is normal. Judgment and thought content normal.    Results for orders placed or performed in visit on 02/13/17  Comp Met (CMET)  Result Value Ref Range   Glucose 99 65 - 99 mg/dL   BUN 16 6 - 20 mg/dL   Creatinine, Ser 1.14 (H) 0.57 - 1.00 mg/dL   GFR calc non Af Amer 61 >59 mL/min/1.73   GFR calc Af Amer 70 >59 mL/min/1.73   BUN/Creatinine Ratio 14 9 - 23   Sodium 138 134 - 144 mmol/L   Potassium 4.1 3.5 - 5.2 mmol/L   Chloride 101 96 - 106 mmol/L   CO2 21 20 - 29 mmol/L   Calcium 9.4 8.7 - 10.2 mg/dL   Total Protein 7.4 6.0 - 8.5 g/dL   Albumin 4.3 3.5 - 5.5 g/dL   Globulin, Total 3.1 1.5 - 4.5 g/dL   Albumin/Globulin Ratio 1.4 1.2 - 2.2   Bilirubin Total 0.9 0.0 - 1.2 mg/dL   Alkaline Phosphatase 36 (L) 39 - 117 IU/L   AST 17 0 - 40 IU/L   ALT 20 0 - 32 IU/L  Hemoglobin A1c  Result Value Ref Range   Hgb  A1c MFr Bld 6.4 (H) 4.8 - 5.6 %   Est. average glucose Bld gHb Est-mCnc 137 mg/dL      Assessment & Plan:   Problem List Items Addressed This Visit      Cardiovascular and Mediastinum   Essential hypertension, benign    The current medical regimen is effective;  continue present plan and medications.         Other   Prediabetes    Diabetes apparently doing well especially with weight loss will continue metformin 500 one a day as doesn't have any GI side effects.Patient will continue doing lifestyle management for controlChecking hemoglobin A1c today will check again in 3 months.        patient with URI symptoms reviewed care and treatment gave samples of throat lozenges and Zyrtec patient education on use.  Follow up plan: Return in about 3 months (around 09/10/2017) for Hemoglobin A1c, BMP,  Lipids, ALT, AST.

## 2017-06-10 NOTE — Assessment & Plan Note (Signed)
The current medical regimen is effective;  continue present plan and medications.  

## 2017-06-10 NOTE — Assessment & Plan Note (Signed)
Diabetes apparently doing well especially with weight loss will continue metformin 500 one a day as doesn't have any GI side effects.Patient will continue doing lifestyle management for controlChecking hemoglobin A1c today will check again in 3 months.

## 2017-06-11 LAB — HEMOGLOBIN A1C
Est. average glucose Bld gHb Est-mCnc: 151 mg/dL
Hgb A1c MFr Bld: 6.9 % — ABNORMAL HIGH (ref 4.8–5.6)

## 2017-06-13 ENCOUNTER — Telehealth: Payer: Self-pay

## 2017-06-13 NOTE — Telephone Encounter (Signed)
Called pt with results. PT verbalized understanding.

## 2017-06-13 NOTE — Telephone Encounter (Signed)
-----   Message from Nori Riis, PA-C sent at 06/12/2017  6:44 PM EST ----- Her HbgA1c is good.

## 2017-08-13 ENCOUNTER — Encounter: Payer: Self-pay | Admitting: Chiropractor

## 2017-08-13 ENCOUNTER — Ambulatory Visit: Payer: Self-pay | Admitting: Chiropractor

## 2017-08-13 DIAGNOSIS — M9901 Segmental and somatic dysfunction of cervical region: Secondary | ICD-10-CM

## 2017-08-13 DIAGNOSIS — M9902 Segmental and somatic dysfunction of thoracic region: Secondary | ICD-10-CM | POA: Insufficient documentation

## 2017-08-13 DIAGNOSIS — M62830 Muscle spasm of back: Secondary | ICD-10-CM | POA: Insufficient documentation

## 2017-08-13 DIAGNOSIS — M9903 Segmental and somatic dysfunction of lumbar region: Secondary | ICD-10-CM | POA: Insufficient documentation

## 2017-08-13 NOTE — Progress Notes (Signed)
S: Patient presents with left sided low back pain. Patient states it started three years ago after working in [employer]. It has progressively gotten worse and patient states "she can't take it anymore". Standing makes it worse, walking makes it better. Pain can be sharp at times, however usually dull and achy. Pain radiates to lateral left thigh. Numbness and pain in lateral left thingh. Pain is a 6/10. Worse in evening, but is always constant. No loss of bowel or bladder control.  O:  Posture - Right csp flexion right elevated hip. Anterior head carriage. ROM - AROM in Csp WNL. AROM decreased with pain in Lsp extension and left lateral flexion. All other Lsp AROM WNL.  Segmental - C3 R, T3 R, L1 R, L5 L, SI R.  TTF/HT Muscles - Lsp B, Tsp R, Csp R  Tenderness - SI R, C3 R, T3 R, L1 R, L5L.   Ortho - Valsalva, braggards, SLR, hibbs, yeomans negative B.  Neuro - Strength L4-S1 5; Dermatomes L4-S1 normal B; Reflexes L4-S1 2.  A: Guarded prognosis. This is patient's first visit. 1x EOW plan based upon availability at the clinic.   P: SMT at levels listed above. Patient received care w/o incident. ROF completed prior to care.

## 2017-08-27 ENCOUNTER — Encounter: Payer: Self-pay | Admitting: Chiropractor

## 2017-08-27 ENCOUNTER — Encounter: Payer: Self-pay | Admitting: Internal Medicine

## 2017-08-27 ENCOUNTER — Ambulatory Visit: Payer: Self-pay | Admitting: Chiropractor

## 2017-08-27 ENCOUNTER — Ambulatory Visit: Payer: Self-pay | Admitting: Internal Medicine

## 2017-08-27 VITALS — BP 128/88 | HR 100 | Wt 302.3 lb

## 2017-08-27 DIAGNOSIS — M62830 Muscle spasm of back: Secondary | ICD-10-CM

## 2017-08-27 DIAGNOSIS — M9901 Segmental and somatic dysfunction of cervical region: Secondary | ICD-10-CM

## 2017-08-27 DIAGNOSIS — M9903 Segmental and somatic dysfunction of lumbar region: Secondary | ICD-10-CM

## 2017-08-27 DIAGNOSIS — M9902 Segmental and somatic dysfunction of thoracic region: Secondary | ICD-10-CM

## 2017-08-27 DIAGNOSIS — K1120 Sialoadenitis, unspecified: Secondary | ICD-10-CM

## 2017-08-27 DIAGNOSIS — I10 Essential (primary) hypertension: Secondary | ICD-10-CM

## 2017-08-27 MED ORDER — LISINOPRIL-HYDROCHLOROTHIAZIDE 20-12.5 MG PO TABS: 2.0000 | ORAL_TABLET | Freq: Every day | ORAL | 1 refills | Status: DC

## 2017-08-27 MED ORDER — GUAIFENESIN-DM 100-10 MG/5ML PO SYRP: 5.0000 mL | ORAL_SOLUTION | ORAL | 1 refills | Status: DC | PRN

## 2017-08-27 MED ORDER — SULFAMETHOXAZOLE-TRIMETHOPRIM 800-160 MG PO TABS: 1.0000 | ORAL_TABLET | Freq: Two times a day (BID) | ORAL | 0 refills | Status: DC

## 2017-08-27 NOTE — Progress Notes (Signed)
   Subjective:    Patient ID: Sheila Hicks, female    DOB: 1975/12/09, 42 y.o.   MRN: [id number]  HPI   Pt presents with a chief complaint of ear pain and a sore spot in the left side of her mouth. These symptoms began this past Saturday.  Allergies as of 08/27/2017   No Known Allergies     Medication List        Accurate as of 08/27/17  9:47 AM. Always use your most recent med list.          ibuprofen 200 MG tablet Commonly known as:  ADVIL,MOTRIN Take 4 mg by mouth every 8 (eight) hours as needed.   lisinopril-hydrochlorothiazide 20-12.5 MG tablet Commonly known as:  PRINZIDE,ZESTORETIC Take 2 tablets by mouth daily.   metFORMIN 500 MG tablet Commonly known as:  GLUCOPHAGE Take 1 tablet (500 mg total) by mouth 2 (two) times daily with a meal.      Patient Active Problem List   Diagnosis Date Noted  . Segmental dysfunction of lumbar region 08/13/2017  . Muscle spasm of back 08/13/2017  . Segmental dysfunction of cervical region 08/13/2017  . Segmental dysfunction of thoracic region 08/13/2017  . Prediabetes 05/16/2016  . Vaginal bleeding 05/16/2016  . Low HDL (under 40) 03/21/2014  . Essential hypertension, benign 02/21/2014  . Obesity 02/21/2014  . History of depression 02/21/2014     Review of Systems     Objective:   Physical Exam  Constitutional: She is oriented to person, place, and time.  Cardiovascular: Normal rate, regular rhythm and normal heart sounds.  Pulmonary/Chest: Effort normal and breath sounds normal.  Neurological: She is alert and oriented to person, place, and time.    BP 128/88   Pulse 100   Wt (!) 302 lb 4.8 oz (137.1 kg)   BMI 45.96 kg/m   Exam shows that ears are mostly clear with some swelling in her nasal passages. There is tenderness at the left parotid gland opening and around the gland.     Assessment & Plan:   Prescribe sulfur antibiotic (BACTRIM DS) and cough syrup for swollen parotid gland.   Refill BP  medication.  Pt has a f/u scheduled.

## 2017-08-27 NOTE — Progress Notes (Signed)
S: Patient presents with left sided low back pain. Patient states it is a lot beter after LPV. Low back felt great for about a week, then pain slowly started increasing over the last week but is still better than before. Pain can be sharp at times, however usually dull and achy. Pain radiates to lateral left thigh. Numbness and pain in lateral left thingh. Pain is a 5/10. Worse in evening, but is always constant. No loss of bowel or bladder control.  O:  Posture - Right csp flexion right elevated hip. Anterior head carriage. ROM - AROM in Csp WNL. AROM decreased with pain in Lsp extension and left lateral flexion. All other Lsp AROM WNL.  Segmental - C3 R, T3 R, L1 R, L5 L, SI R.  TTF/HT Muscles - Lsp B, Tsp R, Csp R  Tenderness - SI R, C3 R, T3 R, L1 R, L5L.   Ortho - Valsalva, braggards, SLR, hibbs, yeomans negative B.  Neuro - Strength L4-S1 5; Dermatomes L4-S1 normal B; Reflexes L4-S1 2.  A: Good prognosis. Patient is progressing faster than expected. Subjective and objective findings decreased. 1x EOW plan based upon availability at the clinic.   P: SMT at levels listed above. Patient received care w/o incident.

## 2017-09-09 ENCOUNTER — Other Ambulatory Visit: Payer: Self-pay

## 2017-09-10 ENCOUNTER — Encounter: Payer: Self-pay | Admitting: Chiropractor

## 2017-09-10 ENCOUNTER — Ambulatory Visit: Payer: Self-pay | Admitting: Chiropractor

## 2017-09-10 DIAGNOSIS — M9903 Segmental and somatic dysfunction of lumbar region: Secondary | ICD-10-CM

## 2017-09-10 DIAGNOSIS — M9901 Segmental and somatic dysfunction of cervical region: Secondary | ICD-10-CM

## 2017-09-10 DIAGNOSIS — M62830 Muscle spasm of back: Secondary | ICD-10-CM

## 2017-09-10 DIAGNOSIS — M9902 Segmental and somatic dysfunction of thoracic region: Secondary | ICD-10-CM

## 2017-09-10 NOTE — Progress Notes (Signed)
S: Patient states her original complaint of left sided low back pain is 95% better. However, patient states the radiating pain into the left lateral thigh has not changed at all. Pain was sharp at times, however usually dull and achy. Pain radiates to lateral left thigh. Numbness and pain in lateral left thingh. Pain is a 2/10. Worse in evening, but is always constant. No loss of bowel or bladder control.  O:  Posture - Right csp flexion right elevated hip. Anterior head carriage. ROM - AROM in Csp WNL. AROM decreased with pain in Lsp extension and left lateral flexion. All other Lsp AROM WNL.  Segmental - C3 R, T3 R, L1 R, L5 L, SI R.  TTF/HT Muscles - Lsp B, Tsp R, Csp R  Tenderness - SI R, C3 R, T3 R, L1 R, L5L.   Ortho - Valsalva, braggards, SLR, hibbs, yeomans negative B.  Neuro - Strength L4-S1 5; Dermatomes L4-S1 normal B; Reflexes L4-S1 2.  A: Good prognosis. Patient is progressing faster than expected. Subjective and objective findings decreased. Released patient from active care and instructed patient to come in in a month for a check up.   P: SMT at levels listed above. Patient received care w/o incident. Referred patient to ortho MD for lateral thigh pain.

## 2017-09-13 ENCOUNTER — Emergency Department
Admission: EM | Admit: 2017-09-13 | Discharge: 2017-09-13 | Disposition: A | Discharge status: Home / Self Care | Payer: Self-pay | Attending: Emergency Medicine | Admitting: Emergency Medicine

## 2017-09-13 ENCOUNTER — Encounter: Payer: Self-pay | Admitting: Emergency Medicine

## 2017-09-13 ENCOUNTER — Other Ambulatory Visit: Payer: Self-pay

## 2017-09-13 DIAGNOSIS — M5416 Radiculopathy, lumbar region: Secondary | ICD-10-CM

## 2017-09-13 DIAGNOSIS — I10 Essential (primary) hypertension: Secondary | ICD-10-CM | POA: Insufficient documentation

## 2017-09-13 DIAGNOSIS — Z79899 Other long term (current) drug therapy: Secondary | ICD-10-CM | POA: Insufficient documentation

## 2017-09-13 MED ORDER — BACLOFEN 10 MG PO TABS: 10.0000 mg | ORAL_TABLET | Freq: Every day | ORAL | 1 refills | Status: DC

## 2017-09-13 MED ORDER — PREDNISONE 10 MG (21) PO TBPK: ORAL_TABLET | ORAL | 0 refills | Status: DC

## 2017-09-13 NOTE — ED Notes (Signed)

## 2017-09-13 NOTE — ED Notes (Signed)
Pt reports having "nerve pain" that is burning in the side of her L leg.  Pt states that he leg gave out due to the pain earlier today.  Pt is A&Ox4, in NAD.  Pt reports that this has been going on for 3-4weeks now.  Pt states that she has been going to the chiropractor.

## 2017-09-13 NOTE — ED Triage Notes (Signed)
Burning shooting pain down L thigh x 3 weeks. No injury. No leg edema.

## 2017-09-13 NOTE — ED Provider Notes (Signed)
[facility] Emergency Department Provider Note  ____________________________________________   First MD Initiated Contact with Patient 09/13/17 1416     (approximate)  I have reviewed the triage vital signs and the nursing notes.   HISTORY  Chief Complaint Leg Pain    HPI Sheila Hicks is a 42 y.o. female complains of radicular type pain to the left leg.  She states she has [facility]  He is done several adjustments.  They are referring her to orthopedics as she continues to have the sciatic nerve pain down the left leg.  She states every time he had just started the pain gets a little more aggravated.  She has had symptoms for 3-4 weeks.  She denies any injury.  She denies any UTI symptoms.  She denies any loss of bowel  Past Medical History:  Diagnosis Date  . Arrhythmia   . Heart murmur    LBBB  . History of depression   . History of hay fever   . History of hypertension   . Hypertension     Patient Active Problem List   Diagnosis Date Noted  . Segmental dysfunction of lumbar region 08/13/2017  . Muscle spasm of back 08/13/2017  . Segmental dysfunction of cervical region 08/13/2017  . Segmental dysfunction of thoracic region 08/13/2017  . Prediabetes 05/16/2016  . Vaginal bleeding 05/16/2016  . Low HDL (under 40) 03/21/2014  . Essential hypertension, benign 02/21/2014  . Obesity 02/21/2014  . History of depression 02/21/2014    History reviewed. No pertinent surgical history.  Prior to Admission medications   Medication Sig Start Date End Date Taking? Authorizing Provider  baclofen (LIORESAL) 10 MG tablet Take 1 tablet (10 mg total) by mouth daily. 09/13/17 09/13/18  Fisher, Linden Dolin, PA-C  ibuprofen (ADVIL,MOTRIN) 200 MG tablet Take 4 mg by mouth every 8 (eight) hours as needed.    [provider]  lisinopril-hydrochlorothiazide (PRINZIDE,ZESTORETIC) 20-12.5 MG tablet Take 2  tablets by mouth daily. 08/27/17   Tawni Millers, MD  metFORMIN (GLUCOPHAGE) 500 MG tablet Take 1 tablet (500 mg total) by mouth 2 (two) times daily with a meal. 10/24/16   Odem, Coolidge Breeze, FNP  predniSONE (STERAPRED UNI-PAK 21 TAB) 10 MG (21) TBPK tablet Take 6 pills on day one then decrease by 1 pill each day 09/13/17   Versie Starks, PA-C  sulfamethoxazole-trimethoprim (BACTRIM DS,SEPTRA DS) 800-160 MG tablet Take 1 tablet by mouth 2 (two) times daily. 08/27/17   Tawni Millers, MD    Allergies Patient has no known allergies.  Family History  Problem Relation Age of Onset  . Hypertension Father   . Hypertension Mother   . Diabetes Mother   . Hypertension Maternal Grandmother   . Diabetes Maternal Grandmother     Social History Social History   Tobacco Use  . Smoking status: Never Smoker  . Smokeless tobacco: Never Used  Substance Use Topics  . Alcohol use: No  . Drug use: No    Review of Systems  Constitutional: No fever/chills Eyes: No visual changes. ENT: No sore throat. Respiratory: Denies cough Genitourinary: Negative for dysuria. Musculoskeletal: Positive for back pain with pain radiating down the left leg Skin: Negative for rash.    ____________________________________________   PHYSICAL EXAM:  VITAL SIGNS: ED Triage Vitals  Enc Vitals Group     BP 09/13/17 1301 (!) 144/97     Pulse Rate 09/13/17 1301 92  Resp 09/13/17 1301 20     Temp 09/13/17 1301 98.7 F (37.1 C)     Temp Source 09/13/17 1301 Oral     SpO2 09/13/17 1301 94 %     Weight 09/13/17 1303 297 lb (134.7 kg)     Height 09/13/17 1303 5\' 9"  (1.753 m)     Head Circumference --      Peak Flow --      Pain Score 09/13/17 1303 10     Pain Loc --      Pain Edu? --      Excl. in [place]? --     Constitutional: Alert and oriented. Well appearing and in no acute distress. Eyes: Conjunctivae are normal.  Head: Atraumatic. Nose: No congestion/rhinnorhea. Mouth/Throat: Mucous membranes are  moist.   Cardiovascular: Normal rate, regular rhythm.  Heart sounds are normal Respiratory: Normal respiratory effort.  No retractions , lungs are clear to auscultation  GU: deferred Musculoskeletal: FROM all extremities, warm and well perfused.  Lumbar spine is negative for bony tenderness.  Patient has full range of motion of the lower legs and feet.  Neurovascularly she is intact. Neurologic:  Normal speech and language.  Skin:  Skin is warm, dry and intact. No rash noted. Psychiatric: Mood and affect are normal. Speech and behavior are normal.  ____________________________________________   LABS (all labs ordered are listed, but only abnormal results are displayed)  Labs Reviewed - No data to display ____________________________________________   ____________________________________________  RADIOLOGY    ____________________________________________   PROCEDURES  Procedure(s) performed: No  Procedures    ____________________________________________   INITIAL IMPRESSION / ASSESSMENT AND PLAN / ED COURSE  Pertinent labs & imaging results that were available during my care of the patient were reviewed by me and considered in my medical decision making (see chart for details).  Patient is a 42 year old female reporting to the emergency department complaining of sciatic type pain in the left leg.  She has had several adjustments by chiropractor recently.  Physical exam the patient appears well.  Lumbar spine is not tender to palpation.  And she is neurovascularly intact  Diagnosis is acute lumbar radiculopathy.  A prescription for Sterapred and baclofen was provided for the patient.  She is to apply ice to the area.  She was instructed to continue stretches that she was instructed on by the chiropractor.  She is to continue to put a pillow between her legs if she is on her side or under her knees if she is on her back while sleeping.  The patient states she understands  that she will follow-up with the orthopedic doctor that she is been referred to.  She will follow our instructions.  She was discharged in stable condition     As part of my medical decision making, I reviewed the following data within the [place] notes reviewed and incorporated, Old chart reviewed, Notes from prior ED visits and [place] Controlled Substance Database  ____________________________________________   FINAL CLINICAL IMPRESSION(S) / ED DIAGNOSES  Final diagnoses:  Radiculopathy, lumbar region      NEW MEDICATIONS STARTED DURING THIS VISIT:  New Prescriptions   BACLOFEN (LIORESAL) 10 MG TABLET    Take 1 tablet (10 mg total) by mouth daily.   PREDNISONE (STERAPRED UNI-PAK 21 TAB) 10 MG (21) TBPK TABLET    Take 6 pills on day one then decrease by 1 pill each day     Note:  This document was prepared using Dragon  voice recognition software and may include unintentional dictation errors.    Versie Starks, PA-C 09/13/17 1431    Lisa Roca, MD 09/13/17 [phone]

## 2017-09-13 NOTE — Discharge Instructions (Signed)
Follow-up with your regular doctor if not better in 3-5 days.  Be sure to follow-up with the orthopedics that [facility].  Take medication as prescribed.  Apply ice to the left hip.  Be sure to sleep with a pillow in between your legs if you are on your side and under your knees if you are on your back.

## 2017-09-16 ENCOUNTER — Ambulatory Visit: Payer: Self-pay

## 2017-09-23 ENCOUNTER — Telehealth: Payer: Self-pay | Admitting: Adult Health Nurse Practitioner

## 2017-09-24 ENCOUNTER — Ambulatory Visit: Payer: Self-pay | Admitting: Internal Medicine

## 2017-09-24 ENCOUNTER — Ambulatory Visit
Admission: RE | Admit: 2017-09-24 | Discharge: 2017-09-24 | Disposition: A | Discharge status: Home / Self Care | Payer: Self-pay | Source: Ambulatory Visit | Attending: Specialist | Admitting: Specialist

## 2017-09-24 ENCOUNTER — Ambulatory Visit
Admission: RE | Admit: 2017-09-24 | Discharge: 2017-09-24 | Disposition: A | Discharge status: Home / Self Care | Payer: Self-pay | Source: Ambulatory Visit | Attending: Urology | Admitting: Urology

## 2017-09-24 ENCOUNTER — Ambulatory Visit: Payer: Self-pay | Admitting: Specialist

## 2017-09-24 ENCOUNTER — Encounter: Payer: Self-pay | Admitting: Urology

## 2017-09-24 DIAGNOSIS — M545 Low back pain: Secondary | ICD-10-CM

## 2017-09-24 DIAGNOSIS — Z0000 Encounter for general adult medical examination without abnormal findings: Secondary | ICD-10-CM

## 2017-09-24 NOTE — Progress Notes (Signed)
   Subjective:    Patient ID: Sheila Hicks, female    DOB: Feb 18, 1976, 42 y.o.   MRN: [id number]  HPI 42 y/o with 10 hx. Of LBP with left ridicular pain to the knee. She has had recently Chiro treatments to no avail. She was recently in the ED and given Baclofen and a Mederol burst again to no avail. Very strong FH of previous spine surgery on her maternal side.    Review of Systems deferred    Objective:   Physical Exam   She weighs an excess of 300 lbs. Gait is normal. Heel/toe is nl. Tandem gait nl. Rhomberg not present. She is able to march in place with normal muscle recruitment and relaxation. She Is able to stand on each leg independently with a neg Tredelenburg sign. Using a long arm gonormenter, she has 24 degrees of left lateral flexion and 28 degrees to the right. From the side, sagital curves are nl. Forward flexion 90 degrees and she can bring her fingers to the floor. Extension 15 degrees with some pain. In the seated position DTR's 2+ and =. Toe signs are downward and MMT 5/5. Sensation intact other than left thigh tingles.      Assessment & Plan:  IMP: LBP probably fasetogenic   Plan: X-rays of the lumbar spine. Return 1 week.

## 2017-09-25 ENCOUNTER — Ambulatory Visit: Payer: Self-pay | Admitting: Licensed Clinical Social Worker

## 2017-09-25 ENCOUNTER — Ambulatory Visit: Payer: Self-pay | Admitting: Adult Health Nurse Practitioner

## 2017-09-25 VITALS — BP 129/80 | HR 101 | Temp 98.4°F | Resp 18 | Wt 309.1 lb

## 2017-09-25 DIAGNOSIS — F332 Major depressive disorder, recurrent severe without psychotic features: Secondary | ICD-10-CM

## 2017-09-25 DIAGNOSIS — Z8659 Personal history of other mental and behavioral disorders: Secondary | ICD-10-CM

## 2017-09-25 DIAGNOSIS — I10 Essential (primary) hypertension: Secondary | ICD-10-CM

## 2017-09-25 LAB — BASIC METABOLIC PANEL
BUN / CREAT RATIO: 15 (ref 9–23)
BUN: 14 mg/dL (ref 6–24)
CO2: 24 mmol/L (ref 20–29)
CREATININE: 0.91 mg/dL (ref 0.57–1.00)
Calcium: 9.6 mg/dL (ref 8.7–10.2)
Chloride: 100 mmol/L (ref 96–106)
GFR calc Af Amer: 91 mL/min/{1.73_m2} (ref >59)
GFR, EST NON AFRICAN AMERICAN: 79 mL/min/{1.73_m2} (ref >59)
GLUCOSE: 171 mg/dL — AB (ref 65–99)
POTASSIUM: 5 mmol/L (ref 3.5–5.2)
SODIUM: 141 mmol/L (ref 134–144)

## 2017-09-25 LAB — LIPID PANEL
CHOL/HDL RATIO: 5 ratio — AB (ref 0.0–4.4)
Cholesterol, Total: 174 mg/dL (ref 100–199)
HDL: 35 mg/dL — AB (ref >39)
LDL CALC: 106 mg/dL — AB (ref 0–99)
Triglycerides: 167 mg/dL — ABNORMAL HIGH (ref 0–149)
VLDL CHOLESTEROL CAL: 33 mg/dL (ref 5–40)

## 2017-09-25 LAB — AST: AST: 11 IU/L (ref 0–40)

## 2017-09-25 LAB — HEMOGLOBIN A1C
ESTIMATED AVERAGE GLUCOSE: 180 mg/dL
Hgb A1c MFr Bld: 7.9 % — ABNORMAL HIGH (ref 4.8–5.6)

## 2017-09-25 LAB — ALT: ALT: 14 IU/L (ref 0–32)

## 2017-09-25 MED ORDER — METFORMIN HCL 1000 MG PO TABS: 1000.0000 mg | ORAL_TABLET | Freq: Two times a day (BID) | ORAL | 4 refills | Status: DC

## 2017-09-25 NOTE — Progress Notes (Signed)
Warm Hand off from Nurse Practioner in clinic. Completed social determinants screening, administered phq 9, and GAD 7. Recommendation follow up for assessment in one week.

## 2017-09-25 NOTE — Progress Notes (Signed)
  Patient: Sheila Hicks Female    DOB: 12/11/1975   42 y.o.   MRN: [id number] Visit Date: 09/25/2017  Today's Provider: [place]   Chief Complaint  Patient presents with  . Depression    Previously on medication   Subjective:    HPI  Saw Heather.  Taking medications as directed. Monitoring diet, not exercising as much- plans to increase activity with warmer weather.     No Known Allergies Previous Medications   BACLOFEN (LIORESAL) 10 MG TABLET    Take 1 tablet (10 mg total) by mouth daily.   IBUPROFEN (ADVIL,MOTRIN) 200 MG TABLET    Take 4 mg by mouth every 8 (eight) hours as needed.   LISINOPRIL-HYDROCHLOROTHIAZIDE (PRINZIDE,ZESTORETIC) 20-12.5 MG TABLET    Take 2 tablets by mouth daily.   METFORMIN (GLUCOPHAGE) 500 MG TABLET    Take 1 tablet (500 mg total) by mouth 2 (two) times daily with a meal.   PREDNISONE (STERAPRED UNI-PAK 21 TAB) 10 MG (21) TBPK TABLET    Take 6 pills on day one then decrease by 1 pill each day   SULFAMETHOXAZOLE-TRIMETHOPRIM (BACTRIM DS,SEPTRA DS) 800-160 MG TABLET    Take 1 tablet by mouth 2 (two) times daily.    Review of Systems  All other systems reviewed and are negative.   Social History   Tobacco Use  . Smoking status: Never Smoker  . Smokeless tobacco: Never Used  Substance Use Topics  . Alcohol use: No   Objective:   BP 129/80   Pulse (!) 101   Temp 98.4 F (36.9 C)   Resp 18   Wt (!) 309 lb 1.6 oz (140.2 kg)   LMP 07/12/2017 (Approximate) Comment: Spotting  BMI 45.65 kg/m   Physical Exam  Constitutional: She is oriented to person, place, and time. She appears well-developed and well-nourished.  HENT:  Head: Normocephalic and atraumatic.  Cardiovascular: Normal rate, regular rhythm and normal heart sounds.  Pulmonary/Chest: Effort normal and breath sounds normal.  Abdominal: Soft. Bowel sounds are normal.  Neurological: She is alert and oriented to person, place, and time.  Skin: Skin is warm and dry.   Vitals reviewed.       Assessment & Plan:        FU with Heather as indicated.  FU with [facility].  Continue current medications with an increase in Metformin to 1000mg  BID, told to notify if GI upset is persistent.  Encourage diet and exercise.     [facility] [place]

## 2017-09-25 NOTE — Patient Instructions (Signed)
Follow up in one week for mental health assessment.

## 2017-09-25 NOTE — Progress Notes (Signed)
Opened in error

## 2017-10-01 ENCOUNTER — Ambulatory Visit: Payer: Self-pay | Admitting: Specialist

## 2017-10-01 ENCOUNTER — Ambulatory Visit: Payer: Self-pay | Admitting: Licensed Clinical Social Worker

## 2017-10-01 DIAGNOSIS — F332 Major depressive disorder, recurrent severe without psychotic features: Secondary | ICD-10-CM

## 2017-10-01 DIAGNOSIS — F411 Generalized anxiety disorder: Secondary | ICD-10-CM

## 2017-10-01 DIAGNOSIS — M542 Cervicalgia: Secondary | ICD-10-CM
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
## 2017-10-01 NOTE — Progress Notes (Signed)
   Subjective:    Patient ID: Sheila Hicks, female    DOB: [birth year], 42 y.o.   MRN: [id number]  HPI See Below   Review of Systems deferred    Objective:   Physical Exam  deferred      Plan:  IMP:She has left sided facet hypertrophy which could explain her radicular symptoms.  Plan:Will send to PT for HEP.

## 2017-10-08 ENCOUNTER — Ambulatory Visit: Payer: Self-pay | Admitting: Licensed Clinical Social Worker

## 2017-10-09 NOTE — Telephone Encounter (Signed)
Clinician attempted to reach out to patient about rescheduling the appointment she missed yesterday at 10:00 am.   There was an option to leave voicemail but the voicemail bos was full.

## 2017-10-14 ENCOUNTER — Other Ambulatory Visit: Payer: Self-pay

## 2017-10-14 DIAGNOSIS — I10 Essential (primary) hypertension: Secondary | ICD-10-CM

## 2017-10-14 MED ORDER — LISINOPRIL-HYDROCHLOROTHIAZIDE 20-12.5 MG PO TABS: 2.0000 | ORAL_TABLET | Freq: Every day | ORAL | 1 refills | Status: DC

## 2017-10-14 NOTE — Progress Notes (Signed)
Pt requested rf [facility] [place].

## 2017-10-29 ENCOUNTER — Telehealth: Payer: Self-pay | Admitting: Licensed Clinical Social Worker

## 2017-10-29 NOTE — Telephone Encounter (Signed)
-----   Message from Kreg Shropshire, [facility] sent at 10/09/2017  1:49 PM EDT ----- Contact: [phone] Pt returning call

## 2017-11-06 ENCOUNTER — Other Ambulatory Visit: Payer: Self-pay | Admitting: Adult Health Nurse Practitioner

## 2017-11-06 ENCOUNTER — Other Ambulatory Visit: Payer: Self-pay | Admitting: Specialist

## 2017-11-06 DIAGNOSIS — M47819 Spondylosis without myelopathy or radiculopathy, site unspecified: Secondary | ICD-10-CM

## 2017-11-06 MED ORDER — MELOXICAM 7.5 MG PO TABS: 7.5000 mg | ORAL_TABLET | Freq: Every day | ORAL | 1 refills | Status: DC

## 2017-11-07 ENCOUNTER — Other Ambulatory Visit: Payer: Self-pay

## 2017-11-07 DIAGNOSIS — M47819 Spondylosis without myelopathy or radiculopathy, site unspecified: Secondary | ICD-10-CM

## 2017-11-07 MED ORDER — MELOXICAM 7.5 MG PO TABS: 7.5000 mg | ORAL_TABLET | Freq: Every day | ORAL | 1 refills | Status: DC

## 2017-12-17 ENCOUNTER — Ambulatory Visit: Payer: Self-pay | Admitting: Internal Medicine

## 2017-12-17 ENCOUNTER — Encounter: Payer: Self-pay | Admitting: Internal Medicine

## 2017-12-17 VITALS — BP 128/77 | HR 87 | Temp 98.8°F | Wt 308.3 lb

## 2017-12-17 DIAGNOSIS — E119 Type 2 diabetes mellitus without complications: Secondary | ICD-10-CM

## 2017-12-17 DIAGNOSIS — J029 Acute pharyngitis, unspecified: Secondary | ICD-10-CM

## 2017-12-17 NOTE — Progress Notes (Signed)
   Subjective:    Patient ID: Sheila Hicks, female    DOB: [birth year], 42 y.o.   MRN: [id number]  HPI  Pt is here for a chief complaint of a sore throat.She went to an urgent care on Monday and was diagnosed with tonsillitis. She has been taking amoxicillin for the past 2 days, but is not feeling any better. She is having trouble swallowing pills.     Allergies as of 12/17/2017   No Known Allergies     Medication List        Accurate as of 12/17/17 10:22 AM. Always use your most recent med list.          ibuprofen 200 MG tablet Commonly known as:  ADVIL,MOTRIN Take 4 mg by mouth every 8 (eight) hours as needed.   lisinopril-hydrochlorothiazide 20-12.5 MG tablet Commonly known as:  PRINZIDE,ZESTORETIC Take 2 tablets by mouth daily.   meloxicam 7.5 MG tablet Commonly known as:  MOBIC Take 1 tablet (7.5 mg total) by mouth daily.   metFORMIN 1000 MG tablet Commonly known as:  GLUCOPHAGE Take 1 tablet (1,000 mg total) by mouth 2 (two) times daily with a meal.      Patient Active Problem List   Diagnosis Date Noted  . Segmental dysfunction of lumbar region 08/13/2017  . Muscle spasm of back 08/13/2017  . Segmental dysfunction of cervical region 08/13/2017  . Segmental dysfunction of thoracic region 08/13/2017  . Prediabetes 05/16/2016  . Vaginal bleeding 05/16/2016  . Low HDL (under 40) 03/21/2014  . Essential hypertension, benign 02/21/2014  . Obesity 02/21/2014  . History of depression 02/21/2014       Objective:   Physical Exam  Constitutional: She is oriented to person, place, and time.  Cardiovascular: Normal rate, regular rhythm and normal heart sounds.  Pulmonary/Chest: Effort normal and breath sounds normal.  Neurological: She is alert and oriented to person, place, and time.    BP 128/77 (BP Location: Left Arm, Patient Position: Sitting)   Pulse 87   Temp 98.8 F (37.1 C) (Oral)   Wt (!) 308 lb 4.8 oz (139.8 kg)   BMI 45.53 kg/m   Ears  appear normal. Throat is red with evidence of tonsillitis. No abnormal masses felt in the neck.       Assessment & Plan:    Order labs today. Referral to endocrinology clinic. Pt has an upcoming appt with another provider at [facility].   Gave pt 10 days worth of zyrtec to address symptoms related to the pt's ear. Will see if antibiotic works.

## 2017-12-18 LAB — COMPREHENSIVE METABOLIC PANEL
A/G RATIO: 1.3 (ref 1.2–2.2)
ALBUMIN: 4.2 g/dL (ref 3.5–5.5)
ALK PHOS: 45 IU/L (ref 39–117)
ALT: 25 IU/L (ref 0–32)
AST: 25 IU/L (ref 0–40)
BILIRUBIN TOTAL: 1 mg/dL (ref 0.0–1.2)
BUN / CREAT RATIO: 15 (ref 9–23)
BUN: 14 mg/dL (ref 6–24)
CHLORIDE: 102 mmol/L (ref 96–106)
CO2: 27 mmol/L (ref 20–29)
Calcium: 9.2 mg/dL (ref 8.7–10.2)
Creatinine, Ser: 0.92 mg/dL (ref 0.57–1.00)
GFR calc non Af Amer: 78 mL/min/{1.73_m2} (ref >59)
GFR, EST AFRICAN AMERICAN: 90 mL/min/{1.73_m2} (ref >59)
GLUCOSE: 113 mg/dL — AB (ref 65–99)
Globulin, Total: 3.3 g/dL (ref 1.5–4.5)
POTASSIUM: 4.7 mmol/L (ref 3.5–5.2)
SODIUM: 142 mmol/L (ref 134–144)
Total Protein: 7.5 g/dL (ref 6.0–8.5)

## 2017-12-18 LAB — URINALYSIS
BILIRUBIN UA: NEGATIVE
GLUCOSE, UA: NEGATIVE
Ketones, UA: NEGATIVE
Leukocytes, UA: NEGATIVE
Nitrite, UA: NEGATIVE
PROTEIN UA: NEGATIVE
RBC UA: NEGATIVE
SPEC GRAV UA: 1.017 (ref 1.005–1.030)
UUROB: 0.2 mg/dL (ref 0.2–1.0)
pH, UA: 5.5 (ref 5.0–7.5)

## 2017-12-18 LAB — CBC
Hematocrit: 38.9 % (ref 34.0–46.6)
Hemoglobin: 12.8 g/dL (ref 11.1–15.9)
MCH: 29 pg (ref 26.6–33.0)
MCHC: 32.9 g/dL (ref 31.5–35.7)
MCV: 88 fL (ref 79–97)
PLATELETS: 247 10*3/uL (ref 150–450)
RBC: 4.42 x10E6/uL (ref 3.77–5.28)
RDW: 14.1 % (ref 12.3–15.4)
WBC: 11.6 10*3/uL — AB (ref 3.4–10.8)

## 2017-12-18 LAB — TSH: TSH: 1.83 u[IU]/mL (ref 0.450–4.500)

## 2017-12-26 ENCOUNTER — Telehealth: Payer: Self-pay

## 2017-12-26 NOTE — Telephone Encounter (Signed)
-----   Message from Sheila Hicks sent at 12/26/2017  9:38 AM EDT ----- From Dr. Lorre Munroe, Dianah Field, MD  Lurena Nida D    OK..mild WBC elevation related to throat..How she is much better.Sheila Hicks

## 2017-12-26 NOTE — Telephone Encounter (Signed)
Called pt to give results of bloodwork and see how she is feeling. Pt states she has been running a 101 fever, rt tonsil is swollen and painful. She went to an urgent care yesterday and was given a prednisone dose pack and doxy 100mg  bid for 7 days. She was unable to get the antibiotic because it was too expensive. Called in abx to [facility] for pt.

## 2018-01-13 ENCOUNTER — Ambulatory Visit: Payer: Self-pay | Admitting: Endocrinology

## 2018-01-13 VITALS — Temp 98.4°F | Ht 66.5 in | Wt 314.4 lb

## 2018-01-13 DIAGNOSIS — E119 Type 2 diabetes mellitus without complications: Secondary | ICD-10-CM

## 2018-01-13 LAB — GLUCOSE, POCT (MANUAL RESULT ENTRY): POC Glucose: 183 mg/dl — AB (ref 70–99)

## 2018-01-13 MED ORDER — SEMAGLUTIDE (1 MG/DOSE) 2 MG/1.5ML ~~LOC~~ SOPN: 1.0000 mg | PEN_INJECTOR | SUBCUTANEOUS | 4 refills | Status: DC

## 2018-01-13 MED ORDER — SEMAGLUTIDE(0.25 OR 0.5MG/DOS) 2 MG/1.5ML ~~LOC~~ SOPN: 0.2500 mg | PEN_INJECTOR | SUBCUTANEOUS | 0 refills | Status: DC

## 2018-01-13 NOTE — Progress Notes (Signed)
Endocrinology Consult  Assessment/Plan:   Assessment and Plan:    1. Diabetes mellitus without complication ([place]) Sheila Hicks is a 42 y.o. female with T2DM in February with cardiovascular risk factors that include obesity (BMI 49.99) and inactivity. Most recent A1c was 7.9 (2/27) and POC glucose was 183 tonight.  Currently only taking metformin 1000 MG QD with no side effects reported.   Added Ozempic 0.25 mg x 4 weeks, 0.5 mg x 4 weeks and then 1 mg weekly thereafter   2. Hypertension Previously diagnosed with HTN and was prescribed lisonopril-HCTZ 20-12.5 MG BID. Tonight blood pressure was 118/72. Reports that this is usual.   3. Fatigue Patients reports chronic fatigue and having "low-energy". TSH was 1.830 (12/17/2017). Does report having trouble sleeping at night but will feel fatigued during the day. Family history is significant for sleep apnea (brother, mother, father).   Subjective:   History of Present Illness:  T2DM Sheila Hicks is a 42 y.o. female seen in consultation at the request of Sheila Hicks after diagnosis with T2DM in February. Currently checking her blood sugars 1-day (morning) -- reported average is 90-100 during a fasting morning glucose. Blood sugar tonight is "high and unusual" because patient reports drinking some coke before coming to clinic.   Typically drinks water but will drink 1-2 diet dr. Malachi Bonds or diet sundrop per week. Patient reports drinking 1-1.5 gallons of water per day, but that is usual for her. Reports "drinking because I want to but not because I am too thirsty". Typically eating 3 meals in a day but has recently started to do meal-replacement shakes. For meals, typically tries to eat a protein and a salad for meals. After eating pasta blood sugar will raise to ~150.   Reports "feeling hungry 24/7".  Acknowledges dysphagia with solid foods. Has to take "small bites" and has been going for "some time" ~5 years. Has not changed in  the past month. Denies dysphagia with water. Denies ever having heartburn. Acknowledges some polyuria (urinating 15-times per day) but denies polydipsia despite consuming large quantities of water.     Creatinine was 0.92 (12/17/2017). Ordered a microalb:cr ratio.         Medical History:   Past Medical History:  Diagnosis Date   Arrhythmia    Heart murmur    LBBB   History of depression    History of hay fever    History of hypertension    Hypertension      No past surgical history on file.   Current Outpatient Medications  Medication Sig Dispense Refill   ibuprofen (ADVIL,MOTRIN) 200 MG tablet Take 4 mg by mouth every 8 (eight) hours as needed.     lisinopril-hydrochlorothiazide (PRINZIDE,ZESTORETIC) 20-12.5 MG tablet Take 2 tablets by mouth daily. 180 tablet 1   meloxicam (MOBIC) 7.5 MG tablet Take 1 tablet (7.5 mg total) by mouth daily. 30 tablet 1   metFORMIN (GLUCOPHAGE) 1000 MG tablet Take 1 tablet (1,000 mg total) by mouth 2 (two) times daily with a meal. 60 tablet 4   No current facility-administered medications for this visit.     No Known Allergies  Family History:  Family History  Problem Relation Age of Onset   Hypertension Father    Hypertension Mother    Diabetes Mother    Hypertension Maternal Grandmother    Diabetes Maternal Grandmother     Social History:  Just graduated phlebotomy school. Stressful work environment, reports being "cussed out" at work and not being  in a "good" environment.  Objective:   Physical Exam:  Temp 98.4 F (36.9 C)    Ht 5' 6.5" (1.689 m)    Wt (!) 314 lb 6.4 oz (142.6 kg)    BMI 49.99 kg/m  Constitutional: Alert, oriented, in NAD ENT: thyroid gland nromal in size, smooth texture. Skin: Acanthosis nigricans around neck Cardiovascular: Regular rhythm, no murmurs, gallops, rubs. Peripheral pulses 2+ Respiratory: Lungs clear, no wheezes or rales  Neurologic: Gait normal, light touch sensation normal in  feet in 5/5 spots bilaterally by monofilament, Feet without lesions   Data Review:  Results for orders placed or performed in visit on 01/13/18  POCT Glucose (CBG)  Result Value Ref Range   POC Glucose 183 (A) 70 - 99 mg/dl     Attestation: I interviewed, examined and discussed the patient with the student at the time of the visit.  I agree with the findings and plan documented.   Prudencio Burly, MD, PhD

## 2018-01-13 NOTE — Patient Instructions (Signed)
Semaglutide injection solution What is this medicine? SEMAGLUTIDE (Sem a GLOO tide) is used to improve blood sugar control in adults with type 2 diabetes. This medicine may be used with other diabetes medicines. This medicine may be used for other purposes; ask your health care provider or pharmacist if you have questions. COMMON BRAND NAME(S): OZEMPIC What should I tell my health care provider before I take this medicine? They need to know if you have any of these conditions: -endocrine tumors (MEN 2) or if someone in your family had these tumors -eye disease, vision problems -history of pancreatitis -kidney disease -stomach problems -thyroid cancer or if someone in your family had thyroid cancer -an unusual or allergic reaction to semaglutide, other medicines, foods, dyes, or preservatives -pregnant or trying to get pregnant -breast-feeding How should I use this medicine? This medicine is for injection under the skin of your upper leg (thigh), stomach area, or upper arm. It is given once every week (every 7 days). You will be taught how to prepare and give this medicine. Use exactly as directed. Take your medicine at regular intervals. Do not take it more often than directed. If you use this medicine with insulin, you should inject this medicine and the insulin separately. Do not mix them together. Do not give the injections right next to each other. Change (rotate) injection sites with each injection. It is important that you put your used needles and syringes in a special sharps container. Do not put them in a trash can. If you do not have a sharps container, call your pharmacist or healthcare provider to get one. A special MedGuide will be given to you by the pharmacist with each prescription and refill. Be sure to read this information carefully each time. Talk to your pediatrician regarding the use of this medicine in children. Special care may be needed. Overdosage: If you think you  have taken too much of this medicine contact a poison control center or emergency room at once. NOTE: This medicine is only for you. Do not share this medicine with others. What if I miss a dose? If you miss a dose, take it as soon as you can within 5 days after the missed dose. Then take your next dose at your regular weekly time. If it has been longer than 5 days after the missed dose, do not take the missed dose. Take the next dose at your regular time. Do not take double or extra doses. If you have questions about a missed dose, contact your health care provider for advice. What may interact with this medicine? -other medicines for diabetes Many medications may cause changes in blood sugar, these include: -alcohol containing beverages -antiviral medicines for HIV or AIDS -aspirin and aspirin-like drugs -certain medicines for blood pressure, heart disease, irregular heart beat -chromium -diuretics -female hormones, such as estrogens or progestins, birth control pills -fenofibrate -gemfibrozil -isoniazid -lanreotide -female hormones or anabolic steroids -MAOIs like Carbex, Eldepryl, Marplan, Nardil, and Parnate -medicines for weight loss -medicines for allergies, asthma, cold, or cough -medicines for depression, anxiety, or psychotic disturbances -niacin -nicotine -NSAIDs, medicines for pain and inflammation, like ibuprofen or naproxen -octreotide -pasireotide -pentamidine -phenytoin -probenecid -quinolone antibiotics such as ciprofloxacin, levofloxacin, ofloxacin -some herbal dietary supplements -steroid medicines such as prednisone or cortisone -sulfamethoxazole; trimethoprim -thyroid hormones Some medications can hide the warning symptoms of low blood sugar (hypoglycemia). You may need to monitor your blood sugar more closely if you are taking one of these medications. These include: -  beta-blockers, often used for high blood pressure or heart problems (examples include  atenolol, metoprolol, propranolol) -clonidine -guanethidine -reserpine This list may not describe all possible interactions. Give your health care provider a list of all the medicines, herbs, non-prescription drugs, or dietary supplements you use. Also tell them if you smoke, drink alcohol, or use illegal drugs. Some items may interact with your medicine. What should I watch for while using this medicine? Visit your doctor or health care professional for regular checks on your progress. Drink plenty of fluids while taking this medicine. Check with your doctor or health care professional if you get an attack of severe diarrhea, nausea, and vomiting. The loss of too much body fluid can make it dangerous for you to take this medicine. A test called the HbA1C (A1C) will be monitored. This is a simple blood test. It measures your blood sugar control over the last 2 to 3 months. You will receive this test every 3 to 6 months. Learn how to check your blood sugar. Learn the symptoms of low and high blood sugar and how to manage them. Always carry a quick-source of sugar with you in case you have symptoms of low blood sugar. Examples include hard sugar candy or glucose tablets. Make sure others know that you can choke if you eat or drink when you develop serious symptoms of low blood sugar, such as seizures or unconsciousness. They must get medical help at once. Tell your doctor or health care professional if you have high blood sugar. You might need to change the dose of your medicine. If you are sick or exercising more than usual, you might need to change the dose of your medicine. Do not skip meals. Ask your doctor or health care professional if you should avoid alcohol. Many nonprescription cough and cold products contain sugar or alcohol. These can affect blood sugar. Pens should never be shared. Even if the needle is changed, sharing may result in passing of viruses like hepatitis or HIV. Wear a medical  ID bracelet or chain, and carry a card that describes your disease and details of your medicine and dosage times. What side effects may I notice from receiving this medicine? Side effects that you should report to your doctor or health care professional as soon as possible: -allergic reactions like skin rash, itching or hives, swelling of the face, lips, or tongue -breathing problems -changes in vision -diarrhea that continues or is severe -lump or swelling on the neck -severe nausea -signs and symptoms of infection like fever or chills; cough; sore throat; pain or trouble passing urine -signs and symptoms of low blood sugar such as feeling anxious, confusion, dizziness, increased hunger, unusually weak or tired, sweating, shakiness, cold, irritable, headache, blurred vision, fast heartbeat, loss of consciousness -signs and symptoms of kidney injury like trouble passing urine or change in the amount of urine -trouble swallowing -unusual stomach upset or pain -vomiting Side effects that usually do not require medical attention (report to your doctor or health care professional if they continue or are bothersome): -constipation -diarrhea -nausea -pain, redness, or irritation at site where injected -stomach upset This list may not describe all possible side effects. Call your doctor for medical advice about side effects. You may report side effects to FDA at 1-800-FDA-1088. Where should I keep my medicine? Keep out of the reach of children. Store unopened pens in a refrigerator between 2 and 8 degrees C (36 and 46 degrees F). Do not freeze. Protect from  to your doctor or health care professional if they continue or are bothersome):  -constipation  -diarrhea  -nausea  -pain, redness, or irritation at site where injected  -stomach upset  This list may not describe all possible side effects. Call your doctor for medical advice about side effects. You may report side effects to FDA at 1-800-FDA-1088.  Where should I keep my medicine?  Keep out of the reach of children.  Store unopened pens in a refrigerator between 2 and 8 degrees C (36 and 46 degrees F). Do not freeze. Protect from light and heat. After you first use the pen, it can be stored for 56 days at room temperature between 15 and 30 degrees C (59 and 86 degrees F) or in a refrigerator. Throw away  your used pen after 56 days or after the expiration date, whichever comes first.  Do not store your pen with the needle attached. If the needle is left on, medicine may leak from the pen.  NOTE: This sheet is a summary. It may not cover all possible information. If you have questions about this medicine, talk to your doctor, pharmacist, or health care provider.  © 2018 [employer] (2016-08-01 14:43:35)

## 2018-01-14 ENCOUNTER — Other Ambulatory Visit: Payer: Self-pay

## 2018-01-14 DIAGNOSIS — M47819 Spondylosis without myelopathy or radiculopathy, site unspecified: Secondary | ICD-10-CM

## 2018-01-14 MED ORDER — MELOXICAM 7.5 MG PO TABS: 7.5000 mg | ORAL_TABLET | Freq: Every day | ORAL | 1 refills | Status: DC

## 2018-01-21 ENCOUNTER — Ambulatory Visit: Payer: Self-pay | Admitting: Internal Medicine

## 2018-03-17 ENCOUNTER — Ambulatory Visit: Payer: Self-pay

## 2018-03-18 ENCOUNTER — Other Ambulatory Visit: Payer: Self-pay

## 2018-03-18 ENCOUNTER — Emergency Department
Admission: EM | Admit: 2018-03-18 | Discharge: 2018-03-18 | Disposition: A | Discharge status: Home / Self Care | Payer: Self-pay | Attending: Emergency Medicine | Admitting: Emergency Medicine

## 2018-03-18 DIAGNOSIS — W109XXA Fall (on) (from) unspecified stairs and steps, initial encounter: Secondary | ICD-10-CM | POA: Insufficient documentation

## 2018-03-18 DIAGNOSIS — S39012A Strain of muscle, fascia and tendon of lower back, initial encounter: Secondary | ICD-10-CM | POA: Insufficient documentation

## 2018-03-18 DIAGNOSIS — Y939 Activity, unspecified: Secondary | ICD-10-CM | POA: Insufficient documentation

## 2018-03-18 DIAGNOSIS — I10 Essential (primary) hypertension: Secondary | ICD-10-CM | POA: Insufficient documentation

## 2018-03-18 DIAGNOSIS — Y999 Unspecified external cause status: Secondary | ICD-10-CM | POA: Insufficient documentation

## 2018-03-18 DIAGNOSIS — Y929 Unspecified place or not applicable: Secondary | ICD-10-CM | POA: Insufficient documentation

## 2018-03-18 DIAGNOSIS — Z79899 Other long term (current) drug therapy: Secondary | ICD-10-CM | POA: Insufficient documentation

## 2018-03-18 DIAGNOSIS — W19XXXA Unspecified fall, initial encounter: Secondary | ICD-10-CM

## 2018-03-18 MED ORDER — KETOROLAC TROMETHAMINE 30 MG/ML IJ SOLN
30.0000 mg | Freq: Once | INTRAMUSCULAR | Status: AC
  Administered 2018-03-18: 30 mg via INTRAMUSCULAR
  Filled 2018-03-18: qty 1

## 2018-03-18 MED ORDER — METHOCARBAMOL 500 MG PO TABS: 500.0000 mg | ORAL_TABLET | Freq: Four times a day (QID) | ORAL | 0 refills | Status: DC | PRN

## 2018-03-18 MED ORDER — NAPROXEN 500 MG PO TABS: 500.0000 mg | ORAL_TABLET | Freq: Two times a day (BID) | ORAL | 0 refills | Status: DC

## 2018-03-18 NOTE — ED Provider Notes (Signed)
[facility] Emergency Department Provider Note  ____________________________________________   First MD Initiated Contact with Patient 03/18/18 0715     (approximate)  I have reviewed the triage vital signs and the nursing notes.   HISTORY  Chief Complaint Back Pain   HPI Sheila Hicks is a 42 y.o. female is here with complaint of low back pain for 3 days.  Patient states that she fell down to very low steps causing her to fall forward and twisting her back.  Since that time she has had increased pain with movement, walking or bending.  She denies any head injury or loss of consciousness.  She states that she has not been able to go to school at night due to her back pain.  She denies any paresthesias or incontinence of bowel or bladder.  Patient was able to drive to the emergency department by herself.  She rates her pain as an 8 out of 10.   Past Medical History:  Diagnosis Date  . Arrhythmia   . Heart murmur    LBBB  . History of depression   . History of hay fever   . History of hypertension   . Hypertension     Patient Active Problem List   Diagnosis Date Noted  . Segmental dysfunction of lumbar region 08/13/2017  . Muscle spasm of back 08/13/2017  . Segmental dysfunction of cervical region 08/13/2017  . Segmental dysfunction of thoracic region 08/13/2017  . Prediabetes 05/16/2016  . Vaginal bleeding 05/16/2016  . Low HDL (under 40) 03/21/2014  . Essential hypertension, benign 02/21/2014  . Obesity 02/21/2014  . History of depression 02/21/2014    No past surgical history on file.  Prior to Admission medications   Medication Sig Start Date End Date Taking? Authorizing Provider  lisinopril-hydrochlorothiazide (PRINZIDE,ZESTORETIC) 20-12.5 MG tablet Take 2 tablets by mouth daily. 10/14/17   Tawni Millers, MD  Melatonin 5 MG TABS Take 1 tablet by mouth at bedtime.    [provider]  methocarbamol (ROBAXIN) 500 MG tablet  Take 1 tablet (500 mg total) by mouth every 6 (six) hours as needed for muscle spasms. 03/18/18   Johnn Hai, PA-C  Multiple Vitamin (MULTIVITAMIN WITH MINERALS) TABS tablet Take 1 tablet by mouth daily.    [provider]  naproxen (NAPROSYN) 500 MG tablet Take 1 tablet (500 mg total) by mouth 2 (two) times daily with a meal. 03/18/18   Letitia Neri L, PA-C  omega-3 acid ethyl esters (LOVAZA) 1 g capsule Take 1 g by mouth daily.    [provider]  Semaglutide (OZEMPIC) 0.25 or 0.5 MG/DOSE SOPN Inject 0.25-0.5 mg into the skin once a week. 0.25 mg weekly for 4 weeks and then 0.5 mg weekly for 4 weeks and then switch to 1 mg pen 01/13/18   Barnet Pall, MD  Semaglutide (OZEMPIC) 1 MG/DOSE SOPN Inject 1 mg into the skin once a week. Start after completing course with 0.25 and 0.5 mg for a month each 01/13/18   Barnet Pall, MD    Allergies Patient has no known allergies.  Family History  Problem Relation Age of Onset  . Hypertension Father   . Hypertension Mother   . Diabetes Mother   . Hypertension Maternal Grandmother   . Diabetes Maternal Grandmother     Social History Social History   Tobacco Use  . Smoking status: Never Smoker  . Smokeless tobacco: Never Used  Substance Use Topics  .  Alcohol use: No  . Drug use: No    Review of Systems Constitutional: No fever/chills Eyes: No visual changes. ENT: Trauma. Cardiovascular: Denies chest pain. Respiratory: Denies shortness of breath. Gastrointestinal: No abdominal pain.  No nausea, no vomiting.  Musculoskeletal: Positive for left-sided back pain. Skin: Negative for rash. Neurological: Negative for headaches, focal weakness or numbness. ____________________________________________   PHYSICAL EXAM:  VITAL SIGNS: ED Triage Vitals  Enc Vitals Group     BP 03/18/18 0708 134/68     Pulse Rate 03/18/18 0708 87     Resp 03/18/18 0708 20     Temp 03/18/18 0708 98.5 F (36.9 C)     Temp Source  03/18/18 0708 Oral     SpO2 03/18/18 0708 96 %     Weight 03/18/18 0649 280 lb (127 kg)     Height 03/18/18 0649 5\' 8"  (1.727 m)     Head Circumference --      Peak Flow --      Pain Score 03/18/18 0649 8     Pain Loc --      Pain Edu? --      Excl. in [place]? --    Constitutional: Alert and oriented. Well appearing and in no acute distress. Eyes: Conjunctivae are normal. PERRL. EOMI. Head: Atraumatic. Nose: No trauma. Neck: No stridor.  Cervical tenderness on palpation posteriorly.  Range of motion is that restriction. Cardiovascular: Normal rate, regular rhythm. Grossly normal heart sounds.  Good peripheral circulation. Respiratory: Normal respiratory effort.  No retractions. Lungs CTAB. Gastrointestinal: Soft and nontender. No distention. Musculoskeletal: On examination of the back there is no gross deformity and no ecchymosis or abrasions were seen.  There is no point tenderness on palpation of the thoracic or lumbar spine.  There is some moderate tenderness on palpation of the left paravertebral muscles.  This reproduces patient's pain.  Range of motion is restricted secondary to pain in this area.  No ecchymosis, abrasions, or soft tissue edema.  No point tenderness is appreciated in the sacral or bilateral hip area.  Patient is able to move upper and lower extremities without any difficulty.  Patient is able to ambulate without assistance.  Lower extremities without injury and are nontender to palpation. Neurologic:  Normal speech and language. No gross focal neurologic deficits are appreciated. No gait instability. Skin:  Skin is warm, dry and intact.  No ecchymosis, abrasions, erythema present. Psychiatric: Mood and affect are normal. Speech and behavior are normal.  ____________________________________________   LABS (all labs ordered are listed, but only abnormal results are displayed)  Labs Reviewed - No data to display  PROCEDURES  Procedure(s) performed:  None  Procedures  Critical Care performed: No  ____________________________________________   INITIAL IMPRESSION / ASSESSMENT AND PLAN / ED COURSE  As part of my medical decision making, I reviewed the following data within the electronic MEDICAL RECORD NUMBER Notes from prior ED visits and [place] Controlled Substance Database  Patient was given Toradol 30 mg IM in the department.  She is aware that she cannot take a muscle relaxant here as she drove and did not bring anyone to drive for her.  She is encouraged to take methocarbamol 500 mg every 6 hours as needed for muscle spasms and naproxen 500 mg twice daily with food.  She is aware that she may take Tylenol with these medications if any extra medication is needed.  Moist warm compresses or ice packs to her back as needed for discomfort.  She is  to follow-up with her PCP if any continued problems. ____________________________________________   FINAL CLINICAL IMPRESSION(S) / ED DIAGNOSES  Final diagnoses:  Strain of lumbar region, initial encounter  Fall, initial encounter     ED Discharge Orders         Ordered    methocarbamol (ROBAXIN) 500 MG tablet  Every 6 hours PRN     03/18/18 0755    naproxen (NAPROSYN) 500 MG tablet  2 times daily with meals     03/18/18 0755           Note:  This document was prepared using Dragon voice recognition software and may include unintentional dictation errors.    Johnn Hai, PA-C 03/18/18 5974    Earleen Newport, MD 03/18/18 [phone]

## 2018-03-18 NOTE — ED Notes (Signed)
See triage note  States she fell on Monday  Now having pain to left lower back which is moving into left leg  Describes pain as "burning" in leg  Ambulates with slight limp d/t pain

## 2018-03-18 NOTE — Discharge Instructions (Signed)
Follow-up with your primary care provider or [facility] acute care if any continued problems.  Begin using ice or heat to your back as needed for discomfort.  Take Robaxin 500 mg every 6 hours only when you are at home.  This medication could cause drowsiness and increase your risk for injury.  Take naproxen 500 mg twice daily with food.  You may also take Tylenol with these medications if needed for extra pain relief.

## 2018-03-18 NOTE — ED Triage Notes (Signed)
Pt fell on Monday and is having lower back pain since states worse to move or walk.

## 2018-03-26 ENCOUNTER — Encounter: Payer: Self-pay | Admitting: Adult Health

## 2018-03-26 ENCOUNTER — Ambulatory Visit: Payer: Self-pay | Admitting: Adult Health

## 2018-03-26 DIAGNOSIS — E1169 Type 2 diabetes mellitus with other specified complication: Secondary | ICD-10-CM

## 2018-03-26 DIAGNOSIS — S335XXA Sprain of ligaments of lumbar spine, initial encounter: Secondary | ICD-10-CM

## 2018-03-26 DIAGNOSIS — M62830 Muscle spasm of back: Secondary | ICD-10-CM

## 2018-03-26 DIAGNOSIS — E119 Type 2 diabetes mellitus without complications: Secondary | ICD-10-CM

## 2018-03-26 DIAGNOSIS — S339XXA Sprain of unspecified parts of lumbar spine and pelvis, initial encounter: Secondary | ICD-10-CM | POA: Insufficient documentation

## 2018-03-26 DIAGNOSIS — S335XXS Sprain of ligaments of lumbar spine, sequela: Secondary | ICD-10-CM

## 2018-03-26 DIAGNOSIS — I10 Essential (primary) hypertension: Secondary | ICD-10-CM

## 2018-03-26 DIAGNOSIS — S339XXS Sprain of unspecified parts of lumbar spine and pelvis, sequela: Secondary | ICD-10-CM

## 2018-03-26 DIAGNOSIS — E785 Hyperlipidemia, unspecified: Secondary | ICD-10-CM

## 2018-03-26 MED ORDER — GABAPENTIN 100 MG PO CAPS: 100.0000 mg | ORAL_CAPSULE | Freq: Three times a day (TID) | ORAL | 1 refills | Status: DC

## 2018-03-26 MED ORDER — LISINOPRIL-HYDROCHLOROTHIAZIDE 20-12.5 MG PO TABS: 2.0000 | ORAL_TABLET | Freq: Every day | ORAL | 1 refills | Status: DC

## 2018-03-26 MED ORDER — OMEGA-3-ACID ETHYL ESTERS 1 G PO CAPS: 1.0000 g | ORAL_CAPSULE | Freq: Every day | ORAL | 1 refills | Status: DC

## 2018-03-26 MED ORDER — ADULT MULTIVITAMIN W/MINERALS CH: 1.0000 | ORAL_TABLET | Freq: Every day | ORAL | 2 refills | Status: AC

## 2018-03-26 MED ORDER — MELATONIN 5 MG PO TABS: 1.0000 | ORAL_TABLET | Freq: Every day | ORAL | 2 refills | Status: DC

## 2018-03-26 MED ORDER — MELOXICAM 15 MG PO TABS: 15.0000 mg | ORAL_TABLET | Freq: Every day | ORAL | 1 refills | Status: AC | PRN

## 2018-03-26 NOTE — Progress Notes (Signed)
Patient: Sheila Hicks Female    DOB: [birth year]   42 y.o.   MRN: [id number] Visit Date: 03/26/2018  Today's Provider: Deforest Hoyles, NP   Chief Complaint  Patient presents with  . Leg Pain    Pt complains of leg/back pain from 'sciatica' x8 yrs, but fell 1 wk ago and pain became worse.   Subjective:    HPI 42 year old female presenting with acute back pain.  She was seen in the ED on the 21st and 22 August respectively and 2 different emergency rooms for the same symptoms.  She was diagnosed with acute left lumbosacral strain with sciatica after she sustained a fall in which she was knocked off the stairs by her neighbor's dog. Prior to the incident, she did have a history of sciatica. Imaging studies showed no fracture.  She was treated with prednisone and IM Toradol without any significant relief. She describes pain as achy, 9/10 in intensity, radiating down her left leg, associated with difficulty walking, numbness and tingling. She cannot think of any relieving factors but state that symptoms are aggravated by activity. She denies chest pain, palpations, nausea, and vomiting  No Known Allergies Previous Medications   LISINOPRIL-HYDROCHLOROTHIAZIDE (PRINZIDE,ZESTORETIC) 20-12.5 MG TABLET    Take 2 tablets by mouth daily.   MELATONIN 5 MG TABS    Take 1 tablet by mouth at bedtime.   MELOXICAM (MOBIC) 7.5 MG TABLET    Take 7.5 mg by mouth daily.   METHOCARBAMOL (ROBAXIN) 500 MG TABLET    Take 1 tablet (500 mg total) by mouth every 6 (six) hours as needed for muscle spasms.   MULTIPLE VITAMIN (MULTIVITAMIN WITH MINERALS) TABS TABLET    Take 1 tablet by mouth daily.   NAPROXEN (NAPROSYN) 500 MG TABLET    Take 1 tablet (500 mg total) by mouth 2 (two) times daily with a meal.   OMEGA-3 ACID ETHYL ESTERS (LOVAZA) 1 G CAPSULE    Take 1 g by mouth daily.   SEMAGLUTIDE (OZEMPIC) 0.25 OR 0.5 MG/DOSE SOPN    Inject 0.25-0.5 mg into the skin once a week. 0.25 mg weekly for 4 weeks and  then 0.5 mg weekly for 4 weeks and then switch to 1 mg pen   SEMAGLUTIDE (OZEMPIC) 1 MG/DOSE SOPN    Inject 1 mg into the skin once a week. Start after completing course with 0.25 and 0.5 mg for a month each    Review of Systems  Constitutional: Negative.   Respiratory: Negative.   Cardiovascular: Negative.   Gastrointestinal: Negative.   Musculoskeletal: Positive for arthralgias (LLE ), back pain and gait problem (limping ).  Skin: Negative.   Neurological: Positive for weakness (LLE) and numbness (LLE).    Social History   Tobacco Use  . Smoking status: Never Smoker  . Smokeless tobacco: Never Used  Substance Use Topics  . Alcohol use: No   Objective:   BP 110/76 (BP Location: Left Arm, Patient Position: Sitting)   Temp 98.3 F (36.8 C) (Oral)   Ht 5' 6.75" (1.695 m)   Wt (!) 315 lb 8 oz (143.1 kg)   LMP  (LMP Unknown)   BMI 49.79 kg/m   Physical Exam  Constitutional: She is oriented to person, place, and time. She appears well-developed and well-nourished.  obese  Eyes: Pupils are equal, round, and reactive to light.  Cardiovascular: Normal rate, regular rhythm, normal heart sounds and intact distal pulses.  Pulmonary/Chest: Effort normal and breath sounds normal.  Abdominal: Soft.  Bowel sounds are normal.  Musculoskeletal: Normal range of motion. She exhibits tenderness (pain with flexion and extension of the left leg).  Neurological: She is alert and oriented to person, place, and time. She displays normal reflexes. No cranial nerve deficit or sensory deficit. She exhibits normal muscle tone.  Skin: Skin is warm and dry. Capillary refill takes less than 2 seconds.   Assessment & Plan:  1. Morbid obesity ([place]) Weight loss advice given  2. Essential hypertension, benign Well controlled. Continue current medications  3. Muscle spasm of back Back exercises reviewed. Patient encouraged to use non-pharmacologic pain interventions-heat packs and exercise  4. Sprain  of ligament of lumbosacral joint, sequela Continue meloxican; will increase dose to 15 mg daily. Alternate with tylenol and also start neurontin 100mg  tid. RTC or go the ED with worsening symptoms  5. Hyperlipidemia associated with type 2 diabetes mellitus ([place])  Continue lovaza. LP as scheduled  6. Controlled type 2 diabetes mellitus without complication, without long-term current use of insulin ([place]) F/u with med management regarding semaglutide  Hasana Alcorta S. [facility] ANP-BC Pulmonary and Critical Care Medicine [facility] Pager [phone] or [phone]  NB: This document was prepared using Dragon voice recognition software and may include unintentional dictation errors.    [facility]

## 2018-04-06 LAB — HM PAP SMEAR

## 2018-04-10 ENCOUNTER — Other Ambulatory Visit: Payer: Self-pay | Admitting: Internal Medicine

## 2018-04-10 DIAGNOSIS — I10 Essential (primary) hypertension: Secondary | ICD-10-CM

## 2018-04-17 ENCOUNTER — Other Ambulatory Visit: Payer: Self-pay | Admitting: Adult Health Nurse Practitioner

## 2018-04-17 DIAGNOSIS — M47819 Spondylosis without myelopathy or radiculopathy, site unspecified: Secondary | ICD-10-CM

## 2018-04-30 ENCOUNTER — Ambulatory Visit: Payer: Self-pay | Admitting: Adult Health

## 2018-05-14 ENCOUNTER — Ambulatory Visit: Payer: Self-pay | Admitting: Adult Health

## 2018-07-03 ENCOUNTER — Telehealth: Payer: Self-pay

## 2018-07-03 NOTE — Telephone Encounter (Signed)
Left vm for patient to call office to schedule followup appt if she needs further refills of meloxicam.

## 2018-07-03 NOTE — Telephone Encounter (Signed)
-----   Message from Erlene Quan, NP sent at 07/03/2018  2:07 AM EST ----- Call patient and tell her she will need to come to the clinic to get refills on meloxicam

## 2018-07-16 ENCOUNTER — Ambulatory Visit: Payer: Self-pay | Admitting: Adult Health

## 2018-07-16 ENCOUNTER — Encounter: Payer: Self-pay | Admitting: Adult Health

## 2018-07-16 VITALS — BP 133/87 | HR 93 | Ht 68.5 in | Wt 318.4 lb

## 2018-07-16 DIAGNOSIS — M47819 Spondylosis without myelopathy or radiculopathy, site unspecified: Secondary | ICD-10-CM

## 2018-07-16 DIAGNOSIS — M47816 Spondylosis without myelopathy or radiculopathy, lumbar region: Secondary | ICD-10-CM | POA: Insufficient documentation

## 2018-07-16 DIAGNOSIS — D500 Iron deficiency anemia secondary to blood loss (chronic): Secondary | ICD-10-CM

## 2018-07-16 DIAGNOSIS — N939 Abnormal uterine and vaginal bleeding, unspecified: Secondary | ICD-10-CM | POA: Insufficient documentation

## 2018-07-16 MED ORDER — FERROUS SULFATE 325 (65 FE) MG PO TABS: 325.0000 mg | ORAL_TABLET | Freq: Two times a day (BID) | ORAL | 3 refills | Status: DC

## 2018-07-16 MED ORDER — MELOXICAM 15 MG PO TABS: 15.0000 mg | ORAL_TABLET | Freq: Every day | ORAL | 2 refills | Status: DC

## 2018-07-16 NOTE — Progress Notes (Signed)
Patient: Sheila Hicks Female    DOB: September 06, 1975   42 y.o.   MRN: [id number] Visit Date: 07/16/2018  Today's Provider: Deforest Hoyles, NP   No chief complaint on file.  Subjective:    HPI 42 y/o female who presents for f/u low back pain with sciatica and complaints of abnormal uterine bleeding that started in October. She states that she ran out of meloxicam and since then her pain has become unbearable. She states that she was seen by ortho but I do not see any documentation in the chart. Last x-ray was in February if this year and showed left-sided facet disease at L4-5, and L5-S1.  She is also c/o abnormal uterine bleeding that started in early October and has progressively gotten worse. [facility] and started on a depo shot without any significant relief of symptoms. She reports passing out large amounts of clots. Also c/o new onset fatigue and occasional dizziness.  She denies chest pain, palpations, nausea and vomiting.    No Known Allergies Previous Medications   GABAPENTIN (NEURONTIN) 100 MG CAPSULE    Take 1 capsule (100 mg total) by mouth 3 (three) times daily.   LISINOPRIL-HYDROCHLOROTHIAZIDE (PRINZIDE,ZESTORETIC) 20-12.5 MG TABLET    Take 2 tablets by mouth daily.   LISINOPRIL-HYDROCHLOROTHIAZIDE (PRINZIDE,ZESTORETIC) 20-12.5 MG TABLET    TAKE 2 TABLETS BY MOUTH EVERY DAY   MELATONIN 5 MG TABS    Take 1 tablet (5 mg total) by mouth at bedtime.   MELOXICAM (MOBIC) 7.5 MG TABLET    TAKE ONE TABLET BY MOUTH DAILY   METHOCARBAMOL (ROBAXIN) 500 MG TABLET    Take 1 tablet (500 mg total) by mouth every 6 (six) hours as needed for muscle spasms.   MULTIPLE VITAMIN (MULTIVITAMIN WITH MINERALS) TABS TABLET    Take 1 tablet by mouth daily.   OMEGA-3 ACID ETHYL ESTERS (LOVAZA) 1 G CAPSULE    Take 1 capsule (1 g total) by mouth daily.   SEMAGLUTIDE (OZEMPIC) 0.25 OR 0.5 MG/DOSE SOPN    Inject 0.25-0.5 mg into the skin once a week. 0.25 mg weekly for 4  weeks and then 0.5 mg weekly for 4 weeks and then switch to 1 mg pen   SEMAGLUTIDE (OZEMPIC) 1 MG/DOSE SOPN    Inject 1 mg into the skin once a week. Start after completing course with 0.25 and 0.5 mg for a month each    Review of Systems  Constitutional: Positive for fatigue.  Respiratory: Negative.   Cardiovascular: Negative.   Genitourinary: Positive for menstrual problem and vaginal bleeding.  Musculoskeletal: Negative.   Skin: Negative.   Neurological: Positive for dizziness.  Hematological:       Uterine bleeding    Social History   Tobacco Use  . Smoking status: Never Smoker  . Smokeless tobacco: Never Used  Substance Use Topics  . Alcohol use: No   Objective:   There were no vitals taken for this visit.  Physical Exam Vitals signs and nursing note reviewed.  Constitutional:      Appearance: She is obese.  Eyes:     Conjunctiva/sclera: Conjunctivae normal.     Pupils: Pupils are equal, round, and reactive to light.  Cardiovascular:     Rate and Rhythm: Normal rate and regular rhythm.     Pulses: Normal pulses.     Heart sounds: Normal heart sounds.  Pulmonary:     Effort: Pulmonary effort is normal.     Breath sounds: Normal  breath sounds.  Abdominal:     General: Bowel sounds are normal.     Palpations: Abdomen is soft.  Genitourinary:    Comments: deferred Musculoskeletal: Normal range of motion.  Neurological:     Mental Status: She is alert.       Assessment & Plan:  1. Facet hypertrophy Continue current medications. Refer to ortho  - meloxicam (MOBIC) 15 MG tablet; Take 1 tablet (15 mg total) by mouth daily.  Dispense: 90 tablet; Refill: 2  2. Abnormal uterine bleeding Already on depo shot - Ambulatory referral to Obstetrics / Gynecology  3. 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

## 2018-07-17 ENCOUNTER — Encounter: Payer: Self-pay | Admitting: Adult Health

## 2018-07-17 NOTE — Addendum Note (Signed)
Addended by: Leda Min on: 07/17/2018 10:46 AM   Modules accepted: Orders

## 2018-07-23 ENCOUNTER — Other Ambulatory Visit: Payer: Self-pay

## 2018-07-23 ENCOUNTER — Encounter: Payer: Self-pay | Admitting: *Deleted

## 2018-07-23 DIAGNOSIS — M47819 Spondylosis without myelopathy or radiculopathy, site unspecified: Secondary | ICD-10-CM

## 2018-07-23 MED ORDER — MELOXICAM 15 MG PO TABS: 15.0000 mg | ORAL_TABLET | Freq: Every day | ORAL | 2 refills | Status: AC

## 2018-07-30 ENCOUNTER — Encounter: Payer: Self-pay | Admitting: Certified Nurse Midwife

## 2018-07-30 ENCOUNTER — Ambulatory Visit (INDEPENDENT_AMBULATORY_CARE_PROVIDER_SITE_OTHER): Payer: PRIVATE HEALTH INSURANCE | Admitting: Certified Nurse Midwife

## 2018-07-30 VITALS — BP 126/87 | HR 102 | Ht 68.5 in | Wt 314.0 lb

## 2018-07-30 DIAGNOSIS — N921 Excessive and frequent menstruation with irregular cycle: Secondary | ICD-10-CM

## 2018-07-30 DIAGNOSIS — R5383 Other fatigue: Secondary | ICD-10-CM

## 2018-07-30 NOTE — Patient Instructions (Signed)
Dysfunctional Uterine Bleeding Dysfunctional uterine bleeding is abnormal bleeding from the uterus. Dysfunctional uterine bleeding includes:  A menstrual period that comes earlier or later than usual.  A menstrual period that is lighter or heavier than usual, or has large blood clots.  Vaginal bleeding between menstrual periods.  Skipping one or more menstrual periods.  Vaginal bleeding after sex.  Vaginal bleeding after menopause. Follow these instructions at home: Eating and drinking   Eat well-balanced meals. Include foods that are high in iron, such as liver, meat, shellfish, green leafy vegetables, and eggs.  To prevent or treat constipation, your health care provider may recommend that you: ? Drink enough fluid to keep your urine pale yellow. ? Take over-the-counter or prescription medicines. ? Eat foods that are high in fiber, such as beans, whole grains, and fresh fruits and vegetables. ? Limit foods that are high in fat and processed sugars, such as fried or sweet foods. Medicines  Take over-the-counter and prescription medicines only as told by your health care provider.  Do not change medicines without talking with your health care provider.  Aspirin or medicines that contain aspirin may make the bleeding worse. Do not take those medicines: ? During the week before your menstrual period. ? During your menstrual period.  If you were prescribed iron pills, take them as told by your health care provider. Iron pills help to replace iron that your body loses because of this condition. Activity  If you need to change your sanitary pad or tampon more than one time every 2 hours: ? Lie in bed with your feet raised (elevated). ? Place a cold pack on your lower abdomen. ? Rest as much as possible until the bleeding stops or slows down.  Do not try to lose weight until the bleeding has stopped and your blood iron level is back to normal. General instructions   For two  months, write down: ? When your menstrual period starts. ? When your menstrual period ends. ? When any abnormal vaginal bleeding occurs. ? What problems you notice.  Keep all follow up visits as told by your health care provider. This is important. Contact a health care provider if you:  Feel light-headed or weak.  Have nausea and vomiting.  Cannot eat or drink without vomiting.  Feel dizzy or have diarrhea while you are taking medicines.  Are taking birth control pills or hormones, and you want to change them or stop taking them. Get help right away if:  You develop a fever or chills.  You need to change your sanitary pad or tampon more than one time per hour.  Your vaginal bleeding becomes heavier, or your flow contains clots more often.  You develop pain in your abdomen.  You lose consciousness.  You develop a rash. Summary  Dysfunctional uterine bleeding is abnormal bleeding from the uterus.  It includes menstrual bleeding of abnormal duration, volume, or regularity.  Bleeding after sex and after menopause are also considered dysfunctional uterine bleeding. This information is not intended to replace advice given to you by your health care provider. Make sure you discuss any questions you have with your health care provider. Document Released: 07/12/2000 Document Revised: 12/24/2017 Document Reviewed: 12/24/2017 Elsevier Interactive Patient Education  2019 [employer]. Medroxyprogesterone injection [Contraceptive] What is this medicine? MEDROXYPROGESTERONE (me DROX ee proe JES te rone) contraceptive injections prevent pregnancy. They provide effective birth control for 3 months. Depo-subQ Provera 104 is also used for treating pain related to endometriosis. This  medicine may be used for other purposes; ask your health care provider or pharmacist if you have questions. COMMON BRAND NAME(S): Depo-Provera, Depo-subQ Provera 104 What should I tell my health care  provider before I take this medicine? They need to know if you have any of these conditions: -frequently drink alcohol -asthma -blood vessel disease or a history of a blood clot in the lungs or legs -bone disease such as osteoporosis -breast cancer -diabetes -eating disorder (anorexia nervosa or bulimia) -high blood pressure -HIV infection or AIDS -kidney disease -liver disease -mental depression -migraine -seizures (convulsions) -stroke -tobacco smoker -vaginal bleeding -an unusual or allergic reaction to medroxyprogesterone, other hormones, medicines, foods, dyes, or preservatives -pregnant or trying to get pregnant -breast-feeding How should I use this medicine? Depo-Provera Contraceptive injection is given into a muscle. Depo-subQ Provera 104 injection is given under the skin. These injections are given by a health care professional. You must not be pregnant before getting an injection. The injection is usually given during the first 5 days after the start of a menstrual period or 6 weeks after delivery of a baby. Talk to your pediatrician regarding the use of this medicine in children. Special care may be needed. These injections have been used in female children who have started having menstrual periods. Overdosage: If you think you have taken too much of this medicine contact a poison control center or emergency room at once. NOTE: This medicine is only for you. Do not share this medicine with others. What if I miss a dose? Try not to miss a dose. You must get an injection once every 3 months to maintain birth control. If you cannot keep an appointment, call and reschedule it. If you wait longer than 13 weeks between Depo-Provera contraceptive injections or longer than 14 weeks between Depo-subQ Provera 104 injections, you could get pregnant. Use another method for birth control if you miss your appointment. You may also need a pregnancy test before receiving another  injection. What may interact with this medicine? Do not take this medicine with any of the following medications: -bosentan This medicine may also interact with the following medications: -aminoglutethimide -antibiotics or medicines for infections, especially rifampin, rifabutin, rifapentine, and griseofulvin -aprepitant -barbiturate medicines such as phenobarbital or primidone -bexarotene -carbamazepine -medicines for seizures like ethotoin, felbamate, oxcarbazepine, phenytoin, topiramate -modafinil -St. John's wort This list may not describe all possible interactions. Give your health care provider a list of all the medicines, herbs, non-prescription drugs, or dietary supplements you use. Also tell them if you smoke, drink alcohol, or use illegal drugs. Some items may interact with your medicine. What should I watch for while using this medicine? This drug does not protect you against HIV infection (AIDS) or other sexually transmitted diseases. Use of this product may cause you to lose calcium from your bones. Loss of calcium may cause weak bones (osteoporosis). Only use this product for more than 2 years if other forms of birth control are not right for you. The longer you use this product for birth control the more likely you will be at risk for weak bones. Ask your health care professional how you can keep strong bones. You may have a change in bleeding pattern or irregular periods. Many females stop having periods while taking this drug. If you have received your injections on time, your chance of being pregnant is very low. If you think you may be pregnant, see your health care professional as soon as possible. Tell your health  care professional if you want to get pregnant within the next year. The effect of this medicine may last a long time after you get your last injection. What side effects may I notice from receiving this medicine? Side effects that you should report to your doctor  or health care professional as soon as possible: -allergic reactions like skin rash, itching or hives, swelling of the face, lips, or tongue -breast tenderness or discharge -breathing problems -changes in vision -depression -feeling faint or lightheaded, falls -fever -pain in the abdomen, chest, groin, or leg -problems with balance, talking, walking -unusually weak or tired -yellowing of the eyes or skin Side effects that usually do not require medical attention (report to your doctor or health care professional if they continue or are bothersome): -acne -fluid retention and swelling -headache -irregular periods, spotting, or absent periods -temporary pain, itching, or skin reaction at site where injected -weight gain This list may not describe all possible side effects. Call your doctor for medical advice about side effects. You may report side effects to FDA at 1-800-FDA-1088. Where should I keep my medicine? This does not apply. The injection will be given to you by a health care professional. NOTE: This sheet is a summary. It may not cover all possible information. If you have questions about this medicine, talk to your doctor, pharmacist, or health care provider.  2019 Elsevier/Gold Standard (2008-08-05 18:37:56)

## 2018-07-30 NOTE — Progress Notes (Signed)
Patient here today with c/o irregular vaginal bleeding.  Patient started having vaginal spotting that started 10/13 and flow became heavy 11/9 through last last.  Last depo was 06/09/2018.  Patient also c/o feeling short of breath, HA and fatigue.

## 2018-07-31 ENCOUNTER — Telehealth: Payer: Self-pay | Admitting: Certified Nurse Midwife

## 2018-07-31 LAB — CBC
HEMATOCRIT: 34.2 % (ref 34.0–46.6)
HEMOGLOBIN: 11.1 g/dL (ref 11.1–15.9)
MCH: 29.4 pg (ref 26.6–33.0)
MCHC: 32.5 g/dL (ref 31.5–35.7)
MCV: 91 fL (ref 79–97)
Platelets: 281 10*3/uL (ref 150–450)
RBC: 3.78 x10E6/uL (ref 3.77–5.28)
RDW: 14.2 % (ref 12.3–15.4)
WBC: 8.2 10*3/uL (ref 3.4–10.8)

## 2018-07-31 LAB — FSH/LH
FSH: 3.4 m[IU]/mL
LH: 1.4 m[IU]/mL

## 2018-07-31 LAB — FERRITIN: Ferritin: 21 ng/mL (ref 15–150)

## 2018-07-31 LAB — TSH: TSH: 1.04 u[IU]/mL (ref 0.450–4.500)

## 2018-07-31 LAB — ESTRADIOL

## 2018-07-31 NOTE — Telephone Encounter (Signed)
The patient called and stated that she needs a note for her employer due to her lifting boxes and passing large blood clots as a result. The patient is hoping to have the note sent to her via MyChart or e-mail if possible. The patient's e-mail is [email or web address]. No other information was disclosed please advise.

## 2018-07-31 NOTE — Telephone Encounter (Signed)
Spoke with patient, she went back to work and having to lift heavy boxes which has made her symptoms worse.   Patient having heavy flow vaginal bleeding and occasional clots, VB <1PPH.  Patient request restriction letter be sent via MyChart so she can show employer she is currently being treated.  Letter sent through [place], patient aware.

## 2018-08-09 NOTE — Progress Notes (Signed)
GYN ENCOUNTER NOTE  Subjective:       Sheila Hicks is a 43 y.o. G80P0020 female is here for gynecologic evaluation of the following issues:  1. Breakthrough bleeding on depo-provera 2. Fatigue 3. Headache 4. Shortness of breath  Reports irregular vaginal bleeding that started as vaginal spotting on 05/10/2018 then increased to heavy bleeding on 06/06/2018.   Notes that her bleeding stopped last night prior to today's appointment.  through last last.  Last depo was 06/09/2018.    Endorses intermittent shortness of breath, headache and fatigue.     First started Depo injections in 2007. Last break June-September 2019.  Denies difficulty breathing or respiratory distress, chest pain, abdominal pain, dysuria, vaginal bleeding, and leg pain or swelling.    Gynecologic History  No LMP recorded. Patient has had an injection. Last dose: 06/09/2018.  Contraception: Depo-Provera injections.   Last Pap: 03/2018. Results were: Negative/Negative.   Last mammogram: due.   Obstetric History  OB History  Gravida Para Term Preterm AB Living  2       2    SAB TAB Ectopic Multiple Live Births  2            # Outcome Date GA Lbr Len/2nd Weight Sex Delivery Anes PTL Lv  2 SAB           1 SAB             Past Medical History:  Diagnosis Date  . Arrhythmia   . Heart murmur    LBBB  . History of depression   . History of hay fever   . History of hypertension   . Hypertension     No past surgical history on file.  Current Outpatient Medications on File Prior to Visit  Medication Sig Dispense Refill  . Ascorbic Acid (VITAMIN C) 1000 MG tablet Take 1,000 mg by mouth daily.    . ferrous sulfate 325 (65 FE) MG tablet Take 1 tablet (325 mg total) by mouth 2 (two) times daily with a meal. 180 tablet 3  . gabapentin (NEURONTIN) 100 MG capsule Take 1 capsule (100 mg total) by mouth 3 (three) times daily. 90 capsule 1  . lisinopril-hydrochlorothiazide (PRINZIDE,ZESTORETIC) 20-12.5 MG  tablet Take 2 tablets by mouth daily. 180 tablet 1  . medroxyPROGESTERone (DEPO-PROVERA) 150 MG/ML injection Inject 150 mg into the muscle every 3 (three) months.    . Melatonin 5 MG TABS Take 1 tablet (5 mg total) by mouth at bedtime. 90 tablet 2  . meloxicam (MOBIC) 15 MG tablet Take 1 tablet (15 mg total) by mouth daily. 90 tablet 2  . Multiple Vitamin (MULTIVITAMIN WITH MINERALS) TABS tablet Take 1 tablet by mouth daily. 90 tablet 2  . omega-3 acid ethyl esters (LOVAZA) 1 g capsule Take 1 capsule (1 g total) by mouth daily. 90 capsule 1   No current facility-administered medications on file prior to visit.     No Known Allergies  Social History   Socioeconomic History  . Marital status: Single    Spouse name: Not on file  . Number of children: Not on file  . Years of education: Not on file  . Highest education level: Not on file  Occupational History  . Not on file  Social Needs  . Financial resource strain: Not on file  . Food insecurity:    Worry: Not on file    Inability: Not on file  . Transportation needs:    Medical: Not on file  Non-medical: Not on file  Tobacco Use  . Smoking status: Never Smoker  . Smokeless tobacco: Never Used  Substance and Sexual Activity  . Alcohol use: No  . Drug use: No  . Sexual activity: Not Currently    Birth control/protection: Injection  Lifestyle  . Physical activity:    Days per week: Not on file    Minutes per session: Not on file  . Stress: Not on file  Relationships  . Social connections:    Talks on phone: Not on file    Gets together: Not on file    Attends religious service: Not on file    Active member of club or organization: Not on file    Attends meetings of clubs or organizations: Not on file    Relationship status: Not on file  . Intimate partner violence:    Fear of current or ex partner: Not on file    Emotionally abused: Not on file    Physically abused: Not on file    Forced sexual activity: Not on  file  Other Topics Concern  . Not on file  Social History Narrative  . Not on file    Family History  Problem Relation Age of Onset  . Hypertension Father   . Hypertension Mother   . Diabetes Mother   . Hypertension Maternal Grandmother   . Diabetes Maternal Grandmother   . Breast cancer Neg Hx   . Ovarian cancer Neg Hx   . Colon cancer Neg Hx     The following portions of the patient's history were reviewed and updated as appropriate: allergies, current medications, past family history, past medical history, past social history, past surgical history and problem list.  Review of Systems  ROS negative except as noted above. Information obtained from patient.   Objective:   BP 126/87   Pulse (!) 102   Ht 5' 8.5" (1.74 m)   Wt (!) 314 lb (142.4 kg)   BMI 47.05 kg/m    CONSTITUTIONAL: Well-developed, well-nourished female in no acute distress.   PHYSICAL EXAM: Not indicated.   Recent Results (from the past 2160 hour(s))  CBC     Status: None   Collection Time: 07/30/18  3:41 PM  Result Value Ref Range   WBC 8.2 3.4 - 10.8 x10E3/uL   RBC 3.78 3.77 - 5.28 x10E6/uL   Hemoglobin 11.1 11.1 - 15.9 g/dL   Hematocrit 34.2 34.0 - 46.6 %   MCV 91 79 - 97 fL   MCH 29.4 26.6 - 33.0 pg   MCHC 32.5 31.5 - 35.7 g/dL   RDW 14.2 12.3 - 15.4 %    Comment: **Effective August 03, 2018, the RDW pediatric reference**   interval will be removed and the adult reference interval   will be changing to:                             Female 11.7 - 15.4                                                      Female 11.6 - 15.4    Platelets 281 150 - 450 x10E3/uL  Estradiol     Status: None   Collection Time: 07/30/18  3:41 PM  Result Value Ref Range  Estradiol <5.0 pg/mL    Comment:                     Adult Female:                       Follicular phase   33.5 -   166.0                       Ovulation phase    85.8 -   498.0                       Luteal phase       43.8 -   211.0                        Postmenopausal     <6.0 -    54.7                     Pregnancy                       1st trimester     215.0 - >4300.0                     Girls (1-10 years)    6.0 -    27.0 Roche ECLIA methodology   Ferritin     Status: None   Collection Time: 07/30/18  3:41 PM  Result Value Ref Range   Ferritin 21 15 - 150 ng/mL  FSH/LH     Status: None   Collection Time: 07/30/18  3:41 PM  Result Value Ref Range   LH 1.4 mIU/mL    Comment:                     Adult Female:                       Follicular phase      2.4 -  12.6                       Ovulation phase      14.0 -  95.6                       Luteal phase          1.0 -  11.4                       Postmenopausal        7.7 -  58.5    FSH 3.4 mIU/mL    Comment:                     Adult Female:                       Follicular phase      3.5 -  12.5                       Ovulation phase       4.7 -  21.5                       Luteal phase          1.7 -  7.7                       Postmenopausal       25.8 - 134.8   TSH     Status: None   Collection Time: 07/30/18  3:41 PM  Result Value Ref Range   TSH 1.040 0.450 - 4.500 uIU/mL    Assessment:   1. Breakthrough bleeding on depo provera  - CBC - Estradiol - Ferritin - FSH/LH - TSH  2. Other fatigue  - CBC - Estradiol - Ferritin - FSH/LH - TSH   Plan:   Labs: see orders. Will contact patient with results.   Declines exam today due to no cessation of bleeding.   Discussed other diagnostic and treatment options.   Patient may schedule ultrasound and follow up appointment if desired.    Diona Fanti, CNM [facility], [facility]

## 2018-08-10 ENCOUNTER — Other Ambulatory Visit: Payer: Self-pay | Admitting: Certified Nurse Midwife

## 2018-08-10 DIAGNOSIS — N921 Excessive and frequent menstruation with irregular cycle: Secondary | ICD-10-CM

## 2018-08-11 ENCOUNTER — Telehealth: Payer: Self-pay | Admitting: Certified Nurse Midwife

## 2018-08-11 NOTE — Telephone Encounter (Signed)
The patient wants to know if she still needs to come in for the [place] and provider visit with Sharyn Lull on Thursday since her estrogen levels are really low, and she stated that [place] sent her a message asking her if she wanted to go on estrogen replacement.  She just wants clarification on the upcoming appointments, please advise, thanks.

## 2018-08-11 NOTE — Telephone Encounter (Signed)
Spoke with patient, advised to keep 1/16 ultrasound and follow-up appointment.  Patient verbalized understanding.

## 2018-08-13 ENCOUNTER — Other Ambulatory Visit: Payer: PRIVATE HEALTH INSURANCE

## 2018-08-13 ENCOUNTER — Encounter: Payer: PRIVATE HEALTH INSURANCE | Admitting: Certified Nurse Midwife

## 2018-08-20 ENCOUNTER — Ambulatory Visit: Payer: Self-pay | Admitting: Adult Health

## 2018-10-22 ENCOUNTER — Ambulatory Visit: Payer: PRIVATE HEALTH INSURANCE | Admitting: Gerontology

## 2018-10-22 ENCOUNTER — Other Ambulatory Visit: Payer: Self-pay

## 2018-10-22 DIAGNOSIS — N939 Abnormal uterine and vaginal bleeding, unspecified: Secondary | ICD-10-CM

## 2018-10-22 DIAGNOSIS — M47819 Spondylosis without myelopathy or radiculopathy, site unspecified: Secondary | ICD-10-CM

## 2018-10-22 DIAGNOSIS — Z0000 Encounter for general adult medical examination without abnormal findings: Secondary | ICD-10-CM

## 2018-10-22 DIAGNOSIS — R7303 Prediabetes: Secondary | ICD-10-CM

## 2018-10-22 NOTE — Patient Instructions (Signed)
Fat and Cholesterol Restricted Eating Plan Getting too much fat and cholesterol in your diet may cause health problems. Choosing the right foods helps keep your fat and cholesterol at normal levels. This can keep you from getting certain diseases. Your doctor may recommend an eating plan that includes:  Total fat: ______% or less of total calories a day.  Saturated fat: ______% or less of total calories a day.  Cholesterol: less than _________mg a day.  Fiber: ______g a day. What are tips for following this plan? Meal planning  At meals, divide your plate into four equal parts: ? Fill one-half of your plate with vegetables and green salads. ? Fill one-fourth of your plate with whole grains. ? Fill one-fourth of your plate with low-fat (lean) protein foods.  Eat fish that is high in omega-3 fats at least two times a week. This includes mackerel, tuna, sardines, and salmon.  Eat foods that are high in fiber, such as whole grains, beans, apples, broccoli, carrots, peas, and barley. General tips   Work with your doctor to lose weight if you need to.  Avoid: ? Foods with added sugar. ? Fried foods. ? Foods with partially hydrogenated oils.  Limit alcohol intake to no more than 1 drink a day for nonpregnant women and 2 drinks a day for men. One drink equals 12 oz of beer, 5 oz of wine, or 1 oz of hard liquor. Reading food labels  Check food labels for: ? Trans fats. ? Partially hydrogenated oils. ? Saturated fat (g) in each serving. ? Cholesterol (mg) in each serving. ? Fiber (g) in each serving.  Choose foods with healthy fats, such as: ? Monounsaturated fats. ? Polyunsaturated fats. ? Omega-3 fats.  Choose grain products that have whole grains. Look for the word "whole" as the first word in the ingredient list. Cooking  Cook foods using low-fat methods. These include baking, boiling, grilling, and broiling.  Eat more home-cooked foods. Eat at restaurants and buffets  less often.  Avoid cooking using saturated fats, such as butter, cream, palm oil, palm kernel oil, and coconut oil. Recommended foods  Fruits  All fresh, canned (in natural juice), or frozen fruits. Vegetables  Fresh or frozen vegetables (raw, steamed, roasted, or grilled). Green salads. Grains  Whole grains, such as whole wheat or whole grain breads, crackers, cereals, and pasta. Unsweetened oatmeal, bulgur, barley, quinoa, or brown rice. Corn or whole wheat flour tortillas. Meats and other protein foods  Ground beef (85% or leaner), grass-fed beef, or beef trimmed of fat. Skinless chicken or turkey. Ground chicken or turkey. Pork trimmed of fat. All fish and seafood. Egg whites. Dried beans, peas, or lentils. Unsalted nuts or seeds. Unsalted canned beans. Nut butters without added sugar or oil. Dairy  Low-fat or nonfat dairy products, such as skim or 1% milk, 2% or reduced-fat cheeses, low-fat and fat-free ricotta or cottage cheese, or plain low-fat and nonfat yogurt. Fats and oils  Tub margarine without trans fats. Light or reduced-fat mayonnaise and salad dressings. Avocado. Olive, canola, sesame, or safflower oils. The items listed above may not be a complete list of foods and beverages you can eat. Contact a dietitian for more information. Foods to avoid Fruits  Canned fruit in heavy syrup. Fruit in cream or butter sauce. Fried fruit. Vegetables  Vegetables cooked in cheese, cream, or butter sauce. Fried vegetables. Grains  White bread. White pasta. White rice. Cornbread. Bagels, pastries, and croissants. Crackers and snack foods that contain trans fat   and hydrogenated oils. Meats and other protein foods  Fatty cuts of meat. Ribs, chicken wings, bacon, sausage, bologna, salami, chitterlings, fatback, hot dogs, bratwurst, and packaged lunch meats. Liver and organ meats. Whole eggs and egg yolks. Chicken and [place] with skin. Fried meat. Dairy  Whole or 2% milk, cream,  half-and-half, and cream cheese. Whole milk cheeses. Whole-fat or sweetened yogurt. Full-fat cheeses. Nondairy creamers and whipped toppings. Processed cheese, cheese spreads, and cheese curds. Beverages  Alcohol. Sugar-sweetened drinks such as sodas, lemonade, and fruit drinks. Fats and oils  Butter, stick margarine, lard, shortening, ghee, or bacon fat. Coconut, palm kernel, and palm oils. Sweets and desserts  Corn syrup, sugars, honey, and molasses. Candy. Jam and jelly. Syrup. Sweetened cereals. Cookies, pies, cakes, donuts, muffins, and ice cream. The items listed above may not be a complete list of foods and beverages you should avoid. Contact a dietitian for more information. Summary  Choosing the right foods helps keep your fat and cholesterol at normal levels. This can keep you from getting certain diseases.  At meals, fill one-half of your plate with vegetables and green salads.  Eat high-fiber foods, like whole grains, beans, apples, carrots, peas, and barley.  Limit added sugar, saturated fats, alcohol, and fried foods. This information is not intended to replace advice given to you by your health care provider. Make sure you discuss any questions you have with your health care provider. Document Released: 01/14/2012 Document Revised: 03/18/2018 Document Reviewed: 04/01/2017 Elsevier Interactive Patient Education  2019 [place]. Carbohydrate Counting for Diabetes Mellitus, Adult  Carbohydrate counting is a method of keeping track of how many carbohydrates you eat. Eating carbohydrates naturally increases the amount of sugar (glucose) in the blood. Counting how many carbohydrates you eat helps keep your blood glucose within normal limits, which helps you manage your diabetes (diabetes mellitus). It is important to know how many carbohydrates you can safely have in each meal. This is different for every person. A diet and nutrition specialist (registered dietitian) can help  you make a meal plan and calculate how many carbohydrates you should have at each meal and snack. Carbohydrates are found in the following foods:  Grains, such as breads and cereals.  Dried beans and soy products.  Starchy vegetables, such as potatoes, peas, and corn.  Fruit and fruit juices.  Milk and yogurt.  Sweets and snack foods, such as cake, cookies, candy, chips, and soft drinks. How do I count carbohydrates? There are two ways to count carbohydrates in food. You can use either of the methods or a combination of both. Reading "Nutrition Facts" on packaged food The "Nutrition Facts" list is included on the labels of almost all packaged foods and beverages in the [place] It includes:  The serving size.  Information about nutrients in each serving, including the grams (g) of carbohydrate per serving. To use the "Nutrition Facts":  Decide how many servings you will have.  Multiply the number of servings by the number of carbohydrates per serving.  The resulting number is the total amount of carbohydrates that you will be having. Learning standard serving sizes of other foods When you eat carbohydrate foods that are not packaged or do not include "Nutrition Facts" on the label, you need to measure the servings in order to count the amount of carbohydrates:  Measure the foods that you will eat with a food scale or measuring cup, if needed.  Decide how many standard-size servings you will eat.  Multiply the number  of servings by 15. Most carbohydrate-rich foods have about 15 g of carbohydrates per serving. ? For example, if you eat 8 oz (170 g) of strawberries, you will have eaten 2 servings and 30 g of carbohydrates (2 servings x 15 g = 30 g).  For foods that have more than one food mixed, such as soups and casseroles, you must count the carbohydrates in each food that is included. The following list contains standard serving sizes of common carbohydrate-rich foods. Each of  these servings has about 15 g of carbohydrates:   hamburger bun or  English muffin.   oz (15 mL) syrup.   oz (14 g) jelly.  1 slice of bread.  1 six-inch tortilla.  3 oz (85 g) cooked rice or pasta.  4 oz (113 g) cooked dried beans.  4 oz (113 g) starchy vegetable, such as peas, corn, or potatoes.  4 oz (113 g) hot cereal.  4 oz (113 g) mashed potatoes or  of a large baked potato.  4 oz (113 g) canned or frozen fruit.  4 oz (120 mL) fruit juice.  4-6 crackers.  6 chicken nuggets.  6 oz (170 g) unsweetened dry cereal.  6 oz (170 g) plain fat-free yogurt or yogurt sweetened with artificial sweeteners.  8 oz (240 mL) milk.  8 oz (170 g) fresh fruit or one small piece of fruit.  24 oz (680 g) popped popcorn. Example of carbohydrate counting Sample meal  3 oz (85 g) chicken breast.  6 oz (170 g) brown rice.  4 oz (113 g) corn.  8 oz (240 mL) milk.  8 oz (170 g) strawberries with sugar-free whipped topping. Carbohydrate calculation 1. Identify the foods that contain carbohydrates: ? Rice. ? Corn. ? Milk. ? Strawberries. 2. Calculate how many servings you have of each food: ? 2 servings rice. ? 1 serving corn. ? 1 serving milk. ? 1 serving strawberries. 3. Multiply each number of servings by 15 g: ? 2 servings rice x 15 g = 30 g. ? 1 serving corn x 15 g = 15 g. ? 1 serving milk x 15 g = 15 g. ? 1 serving strawberries x 15 g = 15 g. 4. Add together all of the amounts to find the total grams of carbohydrates eaten: ? 30 g + 15 g + 15 g + 15 g = 75 g of carbohydrates total. Summary  Carbohydrate counting is a method of keeping track of how many carbohydrates you eat.  Eating carbohydrates naturally increases the amount of sugar (glucose) in the blood.  Counting how many carbohydrates you eat helps keep your blood glucose within normal limits, which helps you manage your diabetes.  A diet and nutrition specialist (registered dietitian) can help  you make a meal plan and calculate how many carbohydrates you should have at each meal and snack. This information is not intended to replace advice given to you by your health care provider. Make sure you discuss any questions you have with your health care provider. Document Released: 07/15/2005 Document Revised: 01/22/2017 Document Reviewed: 12/27/2015 Elsevier Interactive Patient Education  2019 [employer].

## 2018-10-22 NOTE — Progress Notes (Signed)
Established Patient Office Visit  Subjective:  Patient ID: Sheila Hicks, female    DOB: [birth year]  Age: 43 y.o. MRN: [id number]  CC: No chief complaint on file.   HPI Sheila Hicks presents for phone visit*  Low back with sciatica: She states that taking 15 mg Mobic and yoga exercise makes her pain managable. She denies back pain during visit.  Abnormal Uterine bleeding: She states that the bleeding has resolved. She followed up with the Ob/Gyn, and she didn't take depo shot in February. She will follow up at [facility] department for another birth control method. She states that since the bleeding stopped, she didn't follow up for pelvic and transvaginal ultrasound.  Blood glucose: She states that Ozembic was expensive and will prefer metformin. Her blood glucose reading on 09/18/2018 at 8 am was 125 mg/dl, 11 am was 135 mg/dl, and 4;30 pm was 134m.  09/19/2018 at  9:30 was 127 mg/dl, at 1:30 am was 120 mg/dl.  09/20/2018 at 9 am it was 154 mg/dl because she ate Pasta, and at 11 am it was 124 mg/dl. On 09/21/2018 at 11 am, blood glucose reading was 124 mg/dl , at 1 pm, it was 114 mg and 6 pm was 123 mg /dl. 09/22/2018 at 3:30 pm was 121 mg/dl.  She denies hypoglycemia, polyuria, polyphagia and polydipsia.  Morbid Obesity: She states that she continues on her current weight loss regimen. She denies chest pain, palpitation, fever, chills and no further concerns.   Past Medical History:  Diagnosis Date  . Arrhythmia   . Heart murmur    LBBB  . History of depression   . History of hay fever   . History of hypertension   . Hypertension     No past surgical history on file.  Family History  Problem Relation Age of Onset  . Hypertension Father   . Hypertension Mother   . Diabetes Mother   . Hypertension Maternal Grandmother   . Diabetes Maternal Grandmother   . Breast cancer Neg Hx   . Ovarian cancer Neg Hx   . Colon cancer Neg Hx     Social History   Socioeconomic  History  . Marital status: Single    Spouse name: Not on file  . Number of children: Not on file  . Years of education: Not on file  . Highest education level: Not on file  Occupational History  . Not on file  Social Needs  . Financial resource strain: Not on file  . Food insecurity:    Worry: Not on file    Inability: Not on file  . Transportation needs:    Medical: Not on file    Non-medical: Not on file  Tobacco Use  . Smoking status: Never Smoker  . Smokeless tobacco: Never Used  Substance and Sexual Activity  . Alcohol use: No  . Drug use: No  . Sexual activity: Not Currently    Birth control/protection: Injection  Lifestyle  . Physical activity:    Days per week: Not on file    Minutes per session: Not on file  . Stress: Not on file  Relationships  . Social connections:    Talks on phone: Not on file    Gets together: Not on file    Attends religious service: Not on file    Active member of club or organization: Not on file    Attends meetings of clubs or organizations: Not on file    Relationship status:  Not on file  . Intimate partner violence:    Fear of current or ex partner: Not on file    Emotionally abused: Not on file    Physically abused: Not on file    Forced sexual activity: Not on file  Other Topics Concern  . Not on file  Social History Narrative  . Not on file    Outpatient Medications Prior to Visit  Medication Sig Dispense Refill  . Ascorbic Acid (VITAMIN C) 1000 MG tablet Take 1,000 mg by mouth daily.    . ferrous sulfate 325 (65 FE) MG tablet Take 1 tablet (325 mg total) by mouth 2 (two) times daily with a meal. 180 tablet 3  . gabapentin (NEURONTIN) 100 MG capsule Take 1 capsule (100 mg total) by mouth 3 (three) times daily. 90 capsule 1  . lisinopril-hydrochlorothiazide (PRINZIDE,ZESTORETIC) 20-12.5 MG tablet Take 2 tablets by mouth daily. 180 tablet 1  . Melatonin 5 MG TABS Take 1 tablet (5 mg total) by mouth at bedtime. 90 tablet 2  .  meloxicam (MOBIC) 15 MG tablet Take 1 tablet (15 mg total) by mouth daily. 90 tablet 2  . Multiple Vitamin (MULTIVITAMIN WITH MINERALS) TABS tablet Take 1 tablet by mouth daily. 90 tablet 2  . omega-3 acid ethyl esters (LOVAZA) 1 g capsule Take 1 capsule (1 g total) by mouth daily. 90 capsule 1  . medroxyPROGESTERone (DEPO-PROVERA) 150 MG/ML injection Inject 150 mg into the muscle every 3 (three) months.     No facility-administered medications prior to visit.     No Known Allergies  ROS Review of Systems  Constitutional: Negative.   HENT: Negative.   Respiratory: Negative.   Cardiovascular: Negative.   Gastrointestinal: Negative.   Endocrine: Negative.   Genitourinary: Negative.   Musculoskeletal: Positive for back pain (chronic back pain).  Skin: Negative.   Neurological: Negative.   Psychiatric/Behavioral: Negative.       Objective:    Physical Exam  There were no vitals taken for this visit. Wt Readings from Last 3 Encounters:  07/30/18 (!) 314 lb (142.4 kg)  07/16/18 (!) 318 lb 6.4 oz (144.4 kg)  03/26/18 (!) 315 lb 8 oz (143.1 kg)     Health Maintenance Due  Topic Date Due  . PNEUMOCOCCAL POLYSACCHARIDE VACCINE AGE 59-64 HIGH RISK  06/07/1979  . FOOT EXAM  06/07/1987  . OPHTHALMOLOGY EXAM  06/07/1987  . HIV Screening  06/06/1992  . PAP SMEAR-Modifier  06/06/1998  . INFLUENZA VACCINE  02/26/2018  . HEMOGLOBIN A1C  03/24/2018    There are no preventive care reminders to display for this patient.  Lab Results  Component Value Date   TSH 1.040 07/30/2018   Lab Results  Component Value Date   WBC 8.2 07/30/2018   HGB 11.1 07/30/2018   HCT 34.2 07/30/2018   MCV 91 07/30/2018   PLT 281 07/30/2018   Lab Results  Component Value Date   NA 142 12/17/2017   K 4.7 12/17/2017   CO2 27 12/17/2017   GLUCOSE 113 (H) 12/17/2017   BUN 14 12/17/2017   CREATININE 0.92 12/17/2017   BILITOT 1.0 12/17/2017   ALKPHOS 45 12/17/2017   AST 25 12/17/2017   ALT 25  12/17/2017   PROT 7.5 12/17/2017   ALBUMIN 4.2 12/17/2017   CALCIUM 9.2 12/17/2017   ANIONGAP 4 (L) 02/25/2013   GFR 67.17 03/08/2014   Lab Results  Component Value Date   CHOL 174 09/24/2017   Lab Results  Component Value Date  HDL 35 (L) 09/24/2017   Lab Results  Component Value Date   LDLCALC 106 (H) 09/24/2017   Lab Results  Component Value Date   TRIG 167 (H) 09/24/2017   Lab Results  Component Value Date   CHOLHDL 5.0 (H) 09/24/2017   She was encouraged to continue on low fat low cholesterol diet, exercise 30 minutes daily.  Lab Results  Component Value Date   HGBA1C 7.9 (H) 09/24/2017   She was encouraged to continue on low carb diet, exercise 30 minutes daily. HgbA1c will be rechecked prior to initiation of treatment.   Assessment & Plan:     1. Facet hypertrophy -She will continue on current medications. - She will continue to follow up with Dr [facility]ick[facility]  2. Abnormal uterine bleeding -She will follow-up at health department for other birth control method.  3. Morbid obesity ([place]) . - Eat more salad without dressing or with low fat [place] dressing . Avoid  juices, or sodas or any regular sweets/cakes/candies . Decrease the amount of bread, rice, potatoes, pasta or similar carbohydrates rich food . Small portion of food . Nothing fried . Grilled or baked chicken, fish or [place] can be used, avoid red meat . Walk regularly at least 30 minutes daily   4. Prediabetes  - HgB A1c; Future - Comp Met (CMET); Future - Urine Microalbumin w/creat. ratio; Future  5. Healthcare maintenance  - Comp Met (CMET); Future - Lipid panel; Future - Urinalysis; Future  Follow-up: Return in about 6 weeks (around 12/03/2018), or if symptoms worsen or fail to improve.    Reubin Bushnell Jerold Coombe, NP

## 2018-10-28 ENCOUNTER — Other Ambulatory Visit: Payer: Self-pay

## 2018-10-28 ENCOUNTER — Other Ambulatory Visit: Payer: PRIVATE HEALTH INSURANCE

## 2018-10-28 DIAGNOSIS — R7303 Prediabetes: Secondary | ICD-10-CM

## 2018-10-28 DIAGNOSIS — Z0000 Encounter for general adult medical examination without abnormal findings: Secondary | ICD-10-CM

## 2018-10-29 ENCOUNTER — Other Ambulatory Visit: Payer: Self-pay

## 2018-10-29 ENCOUNTER — Other Ambulatory Visit: Payer: Self-pay | Admitting: Gerontology

## 2018-10-29 DIAGNOSIS — E119 Type 2 diabetes mellitus without complications: Secondary | ICD-10-CM

## 2018-10-29 MED ORDER — METFORMIN HCL 1000 MG PO TABS: 500.0000 mg | ORAL_TABLET | Freq: Two times a day (BID) | ORAL | 0 refills | Status: DC

## 2018-10-30 LAB — COMPREHENSIVE METABOLIC PANEL
ALT: 20 IU/L (ref 0–32)
AST: 19 IU/L (ref 0–40)
Albumin/Globulin Ratio: 1.4 (ref 1.2–2.2)
Albumin: 4.3 g/dL (ref 3.8–4.8)
Alkaline Phosphatase: 41 IU/L (ref 39–117)
BUN/Creatinine Ratio: 21 (ref 9–23)
BUN: 18 mg/dL (ref 6–24)
Bilirubin Total: 1.2 mg/dL (ref 0.0–1.2)
CO2: 24 mmol/L (ref 20–29)
Calcium: 9.4 mg/dL (ref 8.7–10.2)
Chloride: 99 mmol/L (ref 96–106)
Creatinine, Ser: 0.86 mg/dL (ref 0.57–1.00)
GFR calc Af Amer: 97 mL/min/{1.73_m2} (ref >59)
GFR calc non Af Amer: 84 mL/min/{1.73_m2} (ref >59)
Globulin, Total: 3 g/dL (ref 1.5–4.5)
Glucose: 118 mg/dL — ABNORMAL HIGH (ref 65–99)
Potassium: 4.7 mmol/L (ref 3.5–5.2)
Sodium: 141 mmol/L (ref 134–144)
Total Protein: 7.3 g/dL (ref 6.0–8.5)

## 2018-10-30 LAB — URINALYSIS
Bilirubin, UA: NEGATIVE
Glucose, UA: NEGATIVE
Ketones, UA: NEGATIVE
Nitrite, UA: NEGATIVE
Protein,UA: NEGATIVE
RBC, UA: NEGATIVE
Specific Gravity, UA: 1.023 (ref 1.005–1.030)
Urobilinogen, Ur: 0.2 mg/dL (ref 0.2–1.0)
pH, UA: 5 (ref 5.0–7.5)

## 2018-10-30 LAB — LIPID PANEL
Chol/HDL Ratio: 4.9 ratio — ABNORMAL HIGH (ref 0.0–4.4)
Cholesterol, Total: 151 mg/dL (ref 100–199)
HDL: 31 mg/dL — ABNORMAL LOW (ref >39)
LDL Calculated: 86 mg/dL (ref 0–99)
Triglycerides: 170 mg/dL — ABNORMAL HIGH (ref 0–149)
VLDL Cholesterol Cal: 34 mg/dL (ref 5–40)

## 2018-10-30 LAB — HEMOGLOBIN A1C
Est. average glucose Bld gHb Est-mCnc: 183 mg/dL
Hgb A1c MFr Bld: 8 % — ABNORMAL HIGH (ref 4.8–5.6)

## 2018-10-30 LAB — MICROALBUMIN / CREATININE URINE RATIO
Creatinine, Urine: 119.1 mg/dL
Microalb/Creat Ratio: 10 mg/g creat (ref 0–29)
Microalbumin, Urine: 11.7 ug/mL

## 2018-11-12 ENCOUNTER — Ambulatory Visit: Payer: PRIVATE HEALTH INSURANCE | Admitting: Gerontology

## 2018-11-17 ENCOUNTER — Other Ambulatory Visit: Payer: Self-pay | Admitting: Gerontology

## 2018-11-17 ENCOUNTER — Ambulatory Visit: Payer: PRIVATE HEALTH INSURANCE | Admitting: Gerontology

## 2018-11-17 ENCOUNTER — Other Ambulatory Visit: Payer: Self-pay

## 2018-11-17 DIAGNOSIS — E119 Type 2 diabetes mellitus without complications: Secondary | ICD-10-CM

## 2018-11-17 MED ORDER — SEMAGLUTIDE(0.25 OR 0.5MG/DOS) 2 MG/1.5ML ~~LOC~~ SOPN: 0.2500 mg | PEN_INJECTOR | SUBCUTANEOUS | 1 refills | Status: DC

## 2018-11-17 NOTE — Progress Notes (Signed)
Established Patient Office Visit  Subjective:  Patient ID: Sheila Hicks, female    DOB: 1975/09/06  Age: 43 y.o. MRN: [id number]  CC:  Chief Complaint  Patient presents with  . Follow-up    type 2 diabetes    HPI Sheila Hicks presents for follow up for type 2 DM and morbid obesity. Her last HgbA1c done on 10/28/2018 was 8 %, and she is currently taking 500 mg metformin bid. She states that she's experiencing intermittent diarrhea, nausea, abdominal cramp and dizziness since she started taking metformin. She reports taking metformin with food, and she denies vomiting,indigestion and rash. She states that she had similar symptoms the last time she took metformin. She wants metformin discontinued and requests to be started on Ozempic, because with coupon it will be affordable. She checks her blood glucose at home and her fasting glucose averages 105 mg/dl and 2 hr post prandial averages 147 mg/dl. She states that she experienced 1 hypoglycemic episode, and blood glucose was 82 mg/dl. Patient is compliant with ADA diet and daily foot checks. She denies peripheral neuropathy.  She states that she's being having intermittent dry cough since she's being on lisinopril-hctz 20-12.5mg  2 tabs daily for over 2 years. She reports weighing 310 pounds and wants her diuretic to be increased which will help her lose weight. Currently she reports having intermittent productive cough with clear phlegm which she said it is likely due to seasonal allergy. She denies chest pain, palpitation, fever, chills and no further concerns.  Past Medical History:  Diagnosis Date  . Arrhythmia   . Heart murmur    LBBB  . History of depression   . History of hay fever   . History of hypertension   . Hypertension     No past surgical history on file.  Family History  Problem Relation Age of Onset  . Hypertension Father   . Hypertension Mother   . Diabetes Mother   . Hypertension Maternal Grandmother   .  Diabetes Maternal Grandmother   . Breast cancer Neg Hx   . Ovarian cancer Neg Hx   . Colon cancer Neg Hx     Social History   Socioeconomic History  . Marital status: Single    Spouse name: Not on file  . Number of children: Not on file  . Years of education: Not on file  . Highest education level: Not on file  Occupational History  . Not on file  Social Needs  . Financial resource strain: Not on file  . Food insecurity:    Worry: Not on file    Inability: Not on file  . Transportation needs:    Medical: Not on file    Non-medical: Not on file  Tobacco Use  . Smoking status: Never Smoker  . Smokeless tobacco: Never Used  Substance and Sexual Activity  . Alcohol use: No  . Drug use: No  . Sexual activity: Not Currently    Birth control/protection: Injection  Lifestyle  . Physical activity:    Days per week: Not on file    Minutes per session: Not on file  . Stress: Not on file  Relationships  . Social connections:    Talks on phone: Not on file    Gets together: Not on file    Attends religious service: Not on file    Active member of club or organization: Not on file    Attends meetings of clubs or organizations: Not on file  Relationship status: Not on file  . Intimate partner violence:    Fear of current or ex partner: Not on file    Emotionally abused: Not on file    Physically abused: Not on file    Forced sexual activity: Not on file  Other Topics Concern  . Not on file  Social History Narrative  . Not on file    Outpatient Medications Prior to Visit  Medication Sig Dispense Refill  . Ascorbic Acid (VITAMIN C) 1000 MG tablet Take 1,000 mg by mouth daily.    . ferrous sulfate 325 (65 FE) MG tablet Take 1 tablet (325 mg total) by mouth 2 (two) times daily with a meal. 180 tablet 3  . gabapentin (NEURONTIN) 100 MG capsule Take 1 capsule (100 mg total) by mouth 3 (three) times daily. 90 capsule 1  . lisinopril-hydrochlorothiazide (PRINZIDE,ZESTORETIC)  20-12.5 MG tablet Take 2 tablets by mouth daily. 180 tablet 1  . Melatonin 5 MG TABS Take 1 tablet (5 mg total) by mouth at bedtime. 90 tablet 2  . meloxicam (MOBIC) 15 MG tablet Take 1 tablet (15 mg total) by mouth daily. 90 tablet 2  . Multiple Vitamin (MULTIVITAMIN WITH MINERALS) TABS tablet Take 1 tablet by mouth daily. 90 tablet 2  . omega-3 acid ethyl esters (LOVAZA) 1 g capsule Take 1 capsule (1 g total) by mouth daily. 90 capsule 1  . metFORMIN (GLUCOPHAGE) 1000 MG tablet Take 0.5 tablets (500 mg total) by mouth 2 (two) times daily with a meal. 60 tablet 0   No facility-administered medications prior to visit.     No Known Allergies  ROS Review of Systems  Constitutional: Negative for chills and fever.  HENT: Negative.   Respiratory: Negative for chest tightness, shortness of breath and wheezing. Cough: intermittent cough with clear phlegm..   Cardiovascular: Negative.   Gastrointestinal: Positive for diarrhea and nausea (with metformin). Negative for constipation and vomiting. Abdominal pain: intermittent cramp.  Endocrine: Negative.   Genitourinary: Negative.   Musculoskeletal: Negative.   Skin: Negative.   Neurological: Negative.   Psychiatric/Behavioral: Negative.       Objective:    Physical Exam   No vital sign and PE done  There were no vitals taken for this visit. Wt Readings from Last 3 Encounters:  07/30/18 (!) 314 lb (142.4 kg)  07/16/18 (!) 318 lb 6.4 oz (144.4 kg)  03/26/18 (!) 315 lb 8 oz (143.1 kg)     Health Maintenance Due  Topic Date Due  . PNEUMOCOCCAL POLYSACCHARIDE VACCINE AGE 68-64 HIGH RISK  06/07/1979  . FOOT EXAM  06/07/1987  . OPHTHALMOLOGY EXAM  06/07/1987  . HIV Screening  06/06/1992  . PAP SMEAR-Modifier  06/06/1998    There are no preventive care reminders to display for this patient.  Lab Results  Component Value Date   TSH 1.040 07/30/2018   Lab Results  Component Value Date   WBC 8.2 07/30/2018   HGB 11.1 07/30/2018    HCT 34.2 07/30/2018   MCV 91 07/30/2018   PLT 281 07/30/2018   Lab Results  Component Value Date   NA 141 10/28/2018   K 4.7 10/28/2018   CO2 24 10/28/2018   GLUCOSE 118 (H) 10/28/2018   BUN 18 10/28/2018   CREATININE 0.86 10/28/2018   BILITOT 1.2 10/28/2018   ALKPHOS 41 10/28/2018   AST 19 10/28/2018   ALT 20 10/28/2018   PROT 7.3 10/28/2018   ALBUMIN 4.3 10/28/2018   CALCIUM 9.4 10/28/2018  ANIONGAP 4 (L) 02/25/2013   GFR 67.17 03/08/2014   Lab Results  Component Value Date   CHOL 151 10/28/2018   Lab Results  Component Value Date   HDL 31 (L) 10/28/2018   Lab Results  Component Value Date   LDLCALC 86 10/28/2018   Lab Results  Component Value Date   TRIG 170 (H) 10/28/2018   Lab Results  Component Value Date   CHOLHDL 4.9 (H) 10/28/2018   Lab Results  Component Value Date   HGBA1C 8.0 (H) 10/28/2018      Assessment & Plan:     1. Type 2 diabetes mellitus without complication, without long-term current use of insulin (HCC) - Metformin 500 mg bid will be discontinued and she will be started on Semaglutide. She was advised on medication side effects and to notify clinic. - Semaglutide,0.25 or 0.5MG /DOS, 2 MG/1.5ML SOPN; Inject 0.25 mg into the skin once a week.  Dispense: 1 pen; Refill: 1 . -Use Diabetic diet as advised  . Check blood sugar 2 times a day, once before breakfast and  2 hours after lunch. . Write down the numbers against date in a log . Bring log to clinic every visit . Take medications regularly as advised . Regular exercise   2. Morbid obesity ([place])       She will continue on current lisinopril -hctz dose. She was advised to notify clinic for worsening cough. Counseled the patient on lifestyle modifications including diet and exercise. Counseled patient on the importance of exercise, starting an exercise program of walking 30 minutes 3-5 times a week. Counseled on low fat, low calorie, portion control diet.   Follow-up: Return  in about 3 weeks (around 12/08/2018), or if symptoms worsen or fail to improve.    Tedford Berg Jerold Coombe, NP

## 2018-11-19 ENCOUNTER — Other Ambulatory Visit: Payer: Self-pay | Admitting: Gerontology

## 2018-11-19 DIAGNOSIS — E119 Type 2 diabetes mellitus without complications: Secondary | ICD-10-CM

## 2018-11-20 ENCOUNTER — Other Ambulatory Visit: Payer: Self-pay | Admitting: Gerontology

## 2018-11-20 DIAGNOSIS — E119 Type 2 diabetes mellitus without complications: Secondary | ICD-10-CM

## 2018-12-01 ENCOUNTER — Other Ambulatory Visit: Payer: Self-pay

## 2018-12-03 ENCOUNTER — Ambulatory Visit: Payer: PRIVATE HEALTH INSURANCE | Admitting: Adult Health

## 2018-12-10 ENCOUNTER — Other Ambulatory Visit: Payer: Self-pay

## 2018-12-10 ENCOUNTER — Ambulatory Visit: Payer: PRIVATE HEALTH INSURANCE | Admitting: Adult Health Nurse Practitioner

## 2018-12-10 DIAGNOSIS — E119 Type 2 diabetes mellitus without complications: Secondary | ICD-10-CM

## 2018-12-10 MED ORDER — CYCLOBENZAPRINE HCL 5 MG PO TABS: 5.0000 mg | ORAL_TABLET | Freq: Every evening | ORAL | 0 refills | Status: DC | PRN

## 2018-12-10 MED ORDER — GLIPIZIDE ER 2.5 MG PO TB24: 2.5000 mg | ORAL_TABLET | Freq: Every day | ORAL | 3 refills | Status: DC

## 2018-12-10 NOTE — Progress Notes (Signed)
  Patient: Sheila Hicks Female    DOB: [birth year]   43 y.o.   MRN: [id number] Visit Date: 12/10/2018  Today's Provider: [facility]   No chief complaint on file.  Subjective:    HPI  Telephonic visit.   Last visit patient's Metformin was discontinued and started on Semaglutide- pt was unable to get the medication due to insurance issues. Continues off Metformin- states it was making her dizzy.  Last A1c was 8.   Pt states that she is exercising regularly and her sciatica has flared back up and she was in so much pain that she could not walk yesterday- states she had a cane to assist with walking- soaked in Epsom salt and applied Biofreeze to the site. States she has been stretching. States that she is taking Meloxicam with some relief. Taking an OTC "Blood Builder"     No Known Allergies Previous Medications   ASCORBIC ACID (VITAMIN C) 1000 MG TABLET    Take 1,000 mg by mouth daily.   FERROUS SULFATE 325 (65 FE) MG TABLET    Take 1 tablet (325 mg total) by mouth 2 (two) times daily with a meal.   GABAPENTIN (NEURONTIN) 100 MG CAPSULE    Take 1 capsule (100 mg total) by mouth 3 (three) times daily.   LISINOPRIL-HYDROCHLOROTHIAZIDE (PRINZIDE,ZESTORETIC) 20-12.5 MG TABLET    Take 2 tablets by mouth daily.   MELATONIN 5 MG TABS    Take 1 tablet (5 mg total) by mouth at bedtime.   MELOXICAM (MOBIC) 15 MG TABLET    Take 1 tablet (15 mg total) by mouth daily.   MULTIPLE VITAMIN (MULTIVITAMIN WITH MINERALS) TABS TABLET    Take 1 tablet by mouth daily.   OMEGA-3 ACID ETHYL ESTERS (LOVAZA) 1 G CAPSULE    Take 1 capsule (1 g total) by mouth daily.   OZEMPIC, 0.25 OR 0.5 MG/DOSE, 2 MG/1.5ML SOPN    INJECT 0.25 MG INTO THE SKIN ONCE A WEEK.    Review of Systems  All other systems reviewed and are negative.   Social History   Tobacco Use  . Smoking status: Never Smoker  . Smokeless tobacco: Never Used  Substance Use Topics  . Alcohol use: No   Objective:   There were no  vitals taken for this visit.  Physical Exam  No PE.     Assessment & Plan:         DM:  Will start Glipizide 2.5mg  qAM- discussed risk for hypoglycemia and to notify if frequent episodes.  Encourage diabetic diet and exercise.   Will give Flexeril PRN for sciatic pain/spasms.  Discussed to take at [facility].       [facility] [place]

## 2018-12-23 ENCOUNTER — Other Ambulatory Visit: Payer: Self-pay

## 2018-12-23 DIAGNOSIS — I10 Essential (primary) hypertension: Secondary | ICD-10-CM

## 2018-12-23 MED ORDER — LISINOPRIL-HYDROCHLOROTHIAZIDE 20-12.5 MG PO TABS: 2.0000 | ORAL_TABLET | Freq: Every day | ORAL | 1 refills | Status: DC

## 2019-01-28 ENCOUNTER — Other Ambulatory Visit: Payer: Self-pay

## 2019-01-28 ENCOUNTER — Ambulatory Visit: Payer: PRIVATE HEALTH INSURANCE | Admitting: Adult Health

## 2019-01-28 DIAGNOSIS — R0609 Other forms of dyspnea: Secondary | ICD-10-CM

## 2019-01-28 DIAGNOSIS — E1159 Type 2 diabetes mellitus with other circulatory complications: Secondary | ICD-10-CM

## 2019-01-28 DIAGNOSIS — I152 Hypertension secondary to endocrine disorders: Secondary | ICD-10-CM

## 2019-01-28 DIAGNOSIS — E119 Type 2 diabetes mellitus without complications: Secondary | ICD-10-CM

## 2019-01-28 DIAGNOSIS — E1169 Type 2 diabetes mellitus with other specified complication: Secondary | ICD-10-CM

## 2019-01-28 DIAGNOSIS — R600 Localized edema: Secondary | ICD-10-CM

## 2019-01-28 NOTE — Progress Notes (Signed)
Patient: Sheila Hicks Female    DOB: August 10, 1975   43 y.o.   MRN: [id number] Visit Date: 01/28/2019  Today's Provider: Deforest Hoyles, NP  Patient consents to Telephone/or Telehealth visit and two identifiers have been used to establish patient's identity prior to visit  Chief Complaint  Patient presents with  . Follow-up    hands and feet swollen for about 1.5 weeks; feet swelling is improving   Subjective:    HPI This is a 43 y/o female seen for f/u HTN, T2DM, and hyperlipemia. She is c/o lower and upper extremity swelling that is associated with exertional dyspnea and orthopnea. She states that feet are more swollen in the morning but gets better in the morning.  She denies chest pain, palpitations and dizziness.  She had a 2D echo in July 2014 that showed a decreased systolic function with a left ventricular ejection fraction of 40 to 45%.  She does not have been a recent 2D echo and has not seen a cardiologist recently.  Her blood pressure is well controlled with systolic readings less than 409 and diastolic less than 90.  Her blood glucose levels have also been normal.  Her last hemoglobin A1c was 8.0.  She is due for a repeat hemoglobin A1c.  She reports taking all medications as prescribed.  She denies any medication side effects. She is also complaining of her left foot knot in the inner aspect of her left ankle.  She denies any associated redness, swelling or pain.  States that the knot comes and goes.    No Known Allergies Previous Medications   ASCORBIC ACID (VITAMIN C) 1000 MG TABLET    Take 1,000 mg by mouth daily.   CYCLOBENZAPRINE (FLEXERIL) 5 MG TABLET    Take 1 tablet (5 mg total) by mouth at bedtime as needed for muscle spasms.   FERROUS SULFATE 325 (65 FE) MG TABLET    Take 1 tablet (325 mg total) by mouth 2 (two) times daily with a meal.   GABAPENTIN (NEURONTIN) 100 MG CAPSULE    Take 1 capsule (100 mg total) by mouth 3 (three) times daily.   GLIPIZIDE  (GLUCOTROL XL) 2.5 MG 24 HR TABLET    Take 1 tablet (2.5 mg total) by mouth daily with breakfast.   LISINOPRIL-HYDROCHLOROTHIAZIDE (ZESTORETIC) 20-12.5 MG TABLET    Take 2 tablets by mouth daily.   MELATONIN 5 MG TABS    Take 1 tablet (5 mg total) by mouth at bedtime.   MELOXICAM (MOBIC) 15 MG TABLET    Take 1 tablet (15 mg total) by mouth daily.   MULTIPLE VITAMIN (MULTIVITAMIN WITH MINERALS) TABS TABLET    Take 1 tablet by mouth daily.   OMEGA-3 ACID ETHYL ESTERS (LOVAZA) 1 G CAPSULE    Take 1 capsule (1 g total) by mouth daily.    Review of Systems  Constitutional: Negative.   Respiratory: Positive for shortness of breath (Dyspnea with exertion).   Cardiovascular: Positive for leg swelling. Negative for chest pain and palpitations.  Gastrointestinal: Negative.   Endocrine: Negative.   Musculoskeletal: Negative.   Skin: Negative for rash.  Neurological: Negative.     Social History   Tobacco Use  . Smoking status: Never Smoker  . Smokeless tobacco: Never Used  Substance Use Topics  . Alcohol use: No   Objective:   There were no vitals taken for this visit.  Physical Exam  Unable to perform a physical exam as this is a virtual visit  Assessment & Plan:  1. Morbid obesity ([place]) Continue weight loss efforts.  Diet and exercise recommendations for the management of diabetes and comorbidities reviewed with patient  2. Type 2 diabetes mellitus without complication, without long-term current use of insulin (HCC) Blood glucose levels are stable.  Continue metformin, low concentrated sweets diet and lifestyle interventions.  Continue to monitor blood glucose levels and keep a log of readings.  We will repeat hemoglobin A1c prior to next visit  3. Dyspnea on exertion Given that patient had a previous echocardiogram that showed a marginal decrease in left ventricular ejection fraction, will repeat a 2D echo and if abnormal will refer to cardiology for further evaluation.   Patient has been advised to continue weight loss interventions and to stop any exercises if dyspnea gets worse.  4. Edema, lower extremity We will rule out CHF.  Continue hydrochlorothiazide and order antihypertensives and antidiabetic medications.  Elevate feet when sitting.  Use compression stockings as needed.  Continue diabetic foot care as recommended.  Notify clinic or go to the emergency room for worsening dyspnea with associated worsening lower extremity edema  5. Hypertension associated with diabetes ([place]) Blood pressures well controlled.  Will repeat labs  6. Hyperlipidemia associated with type 2 diabetes mellitus (HCC) Continue current dose of statin.  Low-fat diet reviewed.  Signs and symptoms of acute coronary syndrome reviewed with patient.  She has been advised to go to the emergency room if she has such symptoms.  Deforest Hoyles, NP   [facility] [place]

## 2019-01-29 ENCOUNTER — Encounter: Payer: Self-pay | Admitting: Adult Health

## 2019-01-29 DIAGNOSIS — R600 Localized edema: Secondary | ICD-10-CM | POA: Insufficient documentation

## 2019-01-29 DIAGNOSIS — R0609 Other forms of dyspnea: Secondary | ICD-10-CM | POA: Insufficient documentation

## 2019-01-29 NOTE — Progress Notes (Signed)
Visit date 01/28/2019

## 2019-02-04 ENCOUNTER — Other Ambulatory Visit: Payer: PRIVATE HEALTH INSURANCE

## 2019-02-11 ENCOUNTER — Ambulatory Visit: Payer: PRIVATE HEALTH INSURANCE

## 2019-02-11 ENCOUNTER — Ambulatory Visit: Payer: PRIVATE HEALTH INSURANCE | Admitting: Adult Health

## 2019-02-12 ENCOUNTER — Other Ambulatory Visit: Payer: Self-pay

## 2019-02-12 ENCOUNTER — Emergency Department
Admission: EM | Admit: 2019-02-12 | Discharge: 2019-02-12 | Disposition: A | Discharge status: Home / Self Care | Payer: Self-pay | Attending: Emergency Medicine | Admitting: Emergency Medicine

## 2019-02-12 ENCOUNTER — Emergency Department: Payer: Self-pay

## 2019-02-12 ENCOUNTER — Encounter: Payer: Self-pay | Admitting: Emergency Medicine

## 2019-02-12 DIAGNOSIS — Z79899 Other long term (current) drug therapy: Secondary | ICD-10-CM | POA: Insufficient documentation

## 2019-02-12 DIAGNOSIS — Z7984 Long term (current) use of oral hypoglycemic drugs: Secondary | ICD-10-CM | POA: Insufficient documentation

## 2019-02-12 DIAGNOSIS — R600 Localized edema: Secondary | ICD-10-CM | POA: Insufficient documentation

## 2019-02-12 DIAGNOSIS — E119 Type 2 diabetes mellitus without complications: Secondary | ICD-10-CM | POA: Insufficient documentation

## 2019-02-12 DIAGNOSIS — R0602 Shortness of breath: Secondary | ICD-10-CM

## 2019-02-12 DIAGNOSIS — R609 Edema, unspecified: Secondary | ICD-10-CM

## 2019-02-12 DIAGNOSIS — I10 Essential (primary) hypertension: Secondary | ICD-10-CM | POA: Insufficient documentation

## 2019-02-12 DIAGNOSIS — J479 Bronchiectasis, uncomplicated: Secondary | ICD-10-CM | POA: Insufficient documentation

## 2019-02-12 HISTORY — DX: Type 2 diabetes mellitus without complications: E11.9

## 2019-02-12 LAB — BASIC METABOLIC PANEL
Anion gap: 10 (ref 5–15)
BUN: 16 mg/dL (ref 6–20)
CO2: 25 mmol/L (ref 22–32)
Calcium: 8.9 mg/dL (ref 8.9–10.3)
Chloride: 102 mmol/L (ref 98–111)
Creatinine, Ser: 0.77 mg/dL (ref 0.44–1.00)
GFR calc Af Amer: >60 mL/min (ref >60)
GFR calc non Af Amer: >60 mL/min (ref >60)
Glucose, Bld: 188 mg/dL — ABNORMAL HIGH (ref 70–99)
Potassium: 3.8 mmol/L (ref 3.5–5.1)
Sodium: 137 mmol/L (ref 135–145)

## 2019-02-12 LAB — CBC
HCT: 40 % (ref 36.0–46.0)
Hemoglobin: 12.7 g/dL (ref 12.0–15.0)
MCH: 27.3 pg (ref 26.0–34.0)
MCHC: 31.8 g/dL (ref 30.0–36.0)
MCV: 85.8 fL (ref 80.0–100.0)
Platelets: 209 10*3/uL (ref 150–400)
RBC: 4.66 MIL/uL (ref 3.87–5.11)
RDW: 14 % (ref 11.5–15.5)
WBC: 7.6 10*3/uL (ref 4.0–10.5)
nRBC: 0 % (ref 0.0–0.2)

## 2019-02-12 LAB — TROPONIN I (HIGH SENSITIVITY)
Troponin I (High Sensitivity): 9 ng/L (ref <18)
Troponin I (High Sensitivity): 10 ng/L (ref <18)

## 2019-02-12 LAB — POCT PREGNANCY, URINE: Preg Test, Ur: NEGATIVE

## 2019-02-12 MED ORDER — FUROSEMIDE 20 MG PO TABS: 20.0000 mg | ORAL_TABLET | Freq: Two times a day (BID) | ORAL | 11 refills | Status: AC

## 2019-02-12 MED ORDER — IOHEXOL 350 MG/ML SOLN
75.0000 mL | Freq: Once | INTRAVENOUS | Status: AC | PRN
  Administered 2019-02-12: 75 mL via INTRAVENOUS

## 2019-02-12 MED ORDER — AZITHROMYCIN 250 MG PO TABS: ORAL_TABLET | ORAL | 0 refills | Status: AC

## 2019-02-12 NOTE — ED Triage Notes (Signed)
Pt reports SOB Since Sunday.Pt also reports pain to left side of her chest that I sharp and stabbing in nature. Pt reports a knot came up on the inside of her foot on Wednesday that is not painful.

## 2019-02-12 NOTE — ED Notes (Signed)
Pt back from CT

## 2019-02-12 NOTE — ED Notes (Signed)
Patient transported to CT 

## 2019-02-12 NOTE — ED Provider Notes (Signed)
[facility] Emergency Department Provider Note   ____________________________________________    I have reviewed the triage vital signs and the nursing notes.   HISTORY  Chief Complaint Shortness of breath    HPI Sheila Hicks is a 43 y.o. female with a history of diabetes, CHF who presents with shortness of breath over the last several days.  She notes that she has had lower extremity edema over the last several weeks, she is not on a diuretic.  Does complain of some occasional pleuritic chest discomfort on the left as well.  No fevers or chills.  No body aches.  No exposure to COVID positive patients.  Has not take anything for this.  Reports compliance with her medications  Past Medical History:  Diagnosis Date  . Arrhythmia   . Diabetes mellitus without complication ([place])   . Heart murmur    LBBB  . History of depression   . History of hay fever   . History of hypertension   . Hypertension     Patient Active Problem List   Diagnosis Date Noted  . Dyspnea on exertion 01/29/2019  . Edema, lower extremity 01/29/2019  . Blood loss anemia 07/16/2018  . Facet hypertrophy 07/16/2018  . Abnormal uterine bleeding 07/16/2018  . 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 Sprain, lumbosacral 03/26/2018  . Segmental dysfunction of lumbar region 08/13/2017  . Muscle spasm of back 08/13/2017  . Segmental dysfunction of cervical region 08/13/2017  . Segmental dysfunction of thoracic region 08/13/2017  . Prediabetes 05/16/2016  . Vaginal bleeding 05/16/2016  . Low HDL (under 40) 03/21/2014  . Hypertension associated with diabetes ([place]) 02/21/2014  . Morbid obesity ([place]) 02/21/2014  . History of depression 02/21/2014    History reviewed. No pertinent surgical history.  Prior to Admission medications   Medication Sig Start Date End Date Taking? Authorizing Provider  Ascorbic Acid (VITAMIN C) 1000 MG tablet Take 1,000 mg by mouth  daily.    [provider]  azithromycin (ZITHROMAX Z-PAK) 250 MG tablet Take 2 tablets (500 mg) on  Day 1,  followed by 1 tablet (250 mg) once daily on Days 2 through 5. 02/12/19 02/17/19  Lavonia Drafts, MD  cyclobenzaprine (FLEXERIL) 5 MG tablet Take 1 tablet (5 mg total) by mouth at bedtime as needed for muscle spasms. Patient not taking: Reported on 01/28/2019 12/10/18   Doles-Johnson, Teah, NP  ferrous sulfate 325 (65 FE) MG tablet Take 1 tablet (325 mg total) by mouth 2 (two) times daily with a meal. 07/16/18   Tukov-Yual, Arlyss Gandy, NP  furosemide (LASIX) 20 MG tablet Take 1 tablet (20 mg total) by mouth 2 (two) times daily. 02/12/19 02/12/20  Lavonia Drafts, MD  gabapentin (NEURONTIN) 100 MG capsule Take 1 capsule (100 mg total) by mouth 3 (three) times daily. Patient not taking: Reported on 01/28/2019 03/26/18   Erlene Quan, NP  glipiZIDE (GLUCOTROL XL) 2.5 MG 24 hr tablet Take 1 tablet (2.5 mg total) by mouth daily with breakfast. 12/10/18   Doles-Johnson, Teah, NP  lisinopril-hydrochlorothiazide (ZESTORETIC) 20-12.5 MG tablet Take 2 tablets by mouth daily. 12/23/18   Tukov-Yual, Arlyss Gandy, NP  Melatonin 5 MG TABS Take 1 tablet (5 mg total) by mouth at bedtime. 03/26/18   Tukov-Yual, Arlyss Gandy, NP  meloxicam (MOBIC) 15 MG tablet Take 1 tablet (15 mg total) by mouth daily. 07/23/18   Erlene Quan, NP  Multiple Vitamin (MULTIVITAMIN WITH MINERALS) TABS tablet Take  1 tablet by mouth daily. Patient not taking: Reported on 01/28/2019 03/26/18   Erlene Quan, NP  omega-3 acid ethyl esters (LOVAZA) 1 g capsule Take 1 capsule (1 g total) by mouth daily. 03/26/18   Erlene Quan, NP     Allergies Patient has no known allergies.  Family History  Problem Relation Age of Onset  . Hypertension Father   . Hypertension Mother   . Diabetes Mother   . Hypertension Maternal Grandmother   . Diabetes Maternal Grandmother   . Breast cancer Neg Hx   . Ovarian  cancer Neg Hx   . Colon cancer Neg Hx     Social History Social History   Tobacco Use  . Smoking status: Never Smoker  . Smokeless tobacco: Never Used  Substance Use Topics  . Alcohol use: No  . Drug use: No    Review of Systems  Constitutional: No fever/chills Eyes: No visual changes.  ENT: No sore throat. Cardiovascular: As above. Respiratory: As above Gastrointestinal: No abdominal pain.  No nausea, no vomiting.   Genitourinary: Negative for dysuria. Musculoskeletal: Negative for back pain. Skin: Negative for rash. Neurological: Negative for headaches   ____________________________________________   PHYSICAL EXAM:  VITAL SIGNS: ED Triage Vitals  Enc Vitals Group     BP 02/12/19 1137 121/73     Pulse Rate 02/12/19 1137 90     Resp 02/12/19 1137 20     Temp 02/12/19 1137 99 F (37.2 C)     Temp Source 02/12/19 1137 Oral     SpO2 02/12/19 1137 95 %     Weight 02/12/19 1134 127 kg (280 lb)     Height 02/12/19 1134 1.727 m (5\' 8" )     Head Circumference --      Peak Flow --      Pain Score 02/12/19 1134 5     Pain Loc --      Pain Edu? --      Excl. in [place]? --     Constitutional: Alert and oriented.  Eyes: Conjunctivae are normal.   Nose: No congestion/rhinnorhea. Mouth/Throat: Mucous membranes are moist.    Cardiovascular: Normal rate, regular rhythm. Grossly normal heart sounds.  Good peripheral circulation. Respiratory: Normal respiratory effort.  No retractions. Lungs CTAB. Gastrointestinal: Soft and nontender. No distention.  No CVA tenderness.  Musculoskeletal: Mild lower extremity edema.  No calf tenderness to palpation.  Warm and well perfused Neurologic:  Normal speech and language. No gross focal neurologic deficits are appreciated.  Skin:  Skin is warm, dry and intact. No rash noted. Psychiatric: Mood and affect are normal. Speech and behavior are normal.  ____________________________________________   LABS (all labs ordered are listed,  but only abnormal results are displayed)  Labs Reviewed  BASIC METABOLIC PANEL - Abnormal; Notable for the following components:      Result Value   Glucose, Bld 188 (*)    All other components within normal limits  CBC  POCT PREGNANCY, URINE  TROPONIN I (HIGH SENSITIVITY)  TROPONIN I (HIGH SENSITIVITY)   ____________________________________________  EKG  ED ECG REPORT I, Lavonia Drafts, the attending physician, personally viewed and interpreted this ECG.  Date: 02/12/2019  Rhythm: normal sinus rhythm QRS Axis: normal Intervals: normal ST/T Wave abnormalities: Left bundle branch block   ____________________________________________  RADIOLOGY  Chest x-ray unremarkable, CT scan no PE, mosaic attenuation concerning for bronchiectasis ____________________________________________   PROCEDURES  Procedure(s) performed: No  Procedures   Critical Care performed: No ____________________________________________  INITIAL IMPRESSION / ASSESSMENT AND PLAN / ED COURSE  Pertinent labs & imaging results that were available during my care of the patient were reviewed by me and considered in my medical decision making (see chart for details).  Patient presents with shortness of breath of gradual onset, some pleurisy as well.  Breathing difficulty is worse when lying flat suspicious for CHF.  She is not on diuretics.  Very reassuring exam and patient is quite comfortable however given pleuritic component possibility of PE as well versus pneumonia.  Chest x-ray is reassuring, lab work is unremarkable.  Will send for CTA given risk factors.  CT scan negative for PE, concerning for possible bronchiectasis.  Discussed this with the patient.  We will start her on Lasix, azithromycin for possible infection and have her follow-up with pulmonology for further evaluation work-up.  Strict return precautions discussed    ____________________________________________   FINAL CLINICAL  IMPRESSION(S) / ED DIAGNOSES  Final diagnoses:  Shortness of breath  Bronchiectasis without complication ([place])  Peripheral edema        Note:  This document was prepared using Dragon voice recognition software and may include unintentional dictation errors.   Lavonia Drafts, MD 02/12/19 [phone]

## 2019-04-07 DIAGNOSIS — Z6841 Body Mass Index (BMI) 40.0 and over, adult: Secondary | ICD-10-CM | POA: Insufficient documentation

## 2019-04-07 DIAGNOSIS — E66813 Obesity, class 3: Secondary | ICD-10-CM | POA: Insufficient documentation

## 2019-04-08 ENCOUNTER — Ambulatory Visit: Payer: PRIVATE HEALTH INSURANCE | Admitting: Adult Health

## 2019-04-08 IMAGING — CR DG LUMBAR SPINE COMPLETE 4+V
1 series · 5 of 5 positions shown · non-contrast
Comparison: None.

CLINICAL DATA: Low back pain extending to left lower extremity.

EXAM:
LUMBAR SPINE - COMPLETE 4+ VIEW

[Series 1: dg lumbar spine complete 4 +v · 0.14mm/px · 5 of 5 slices shown]
[im 1/5]
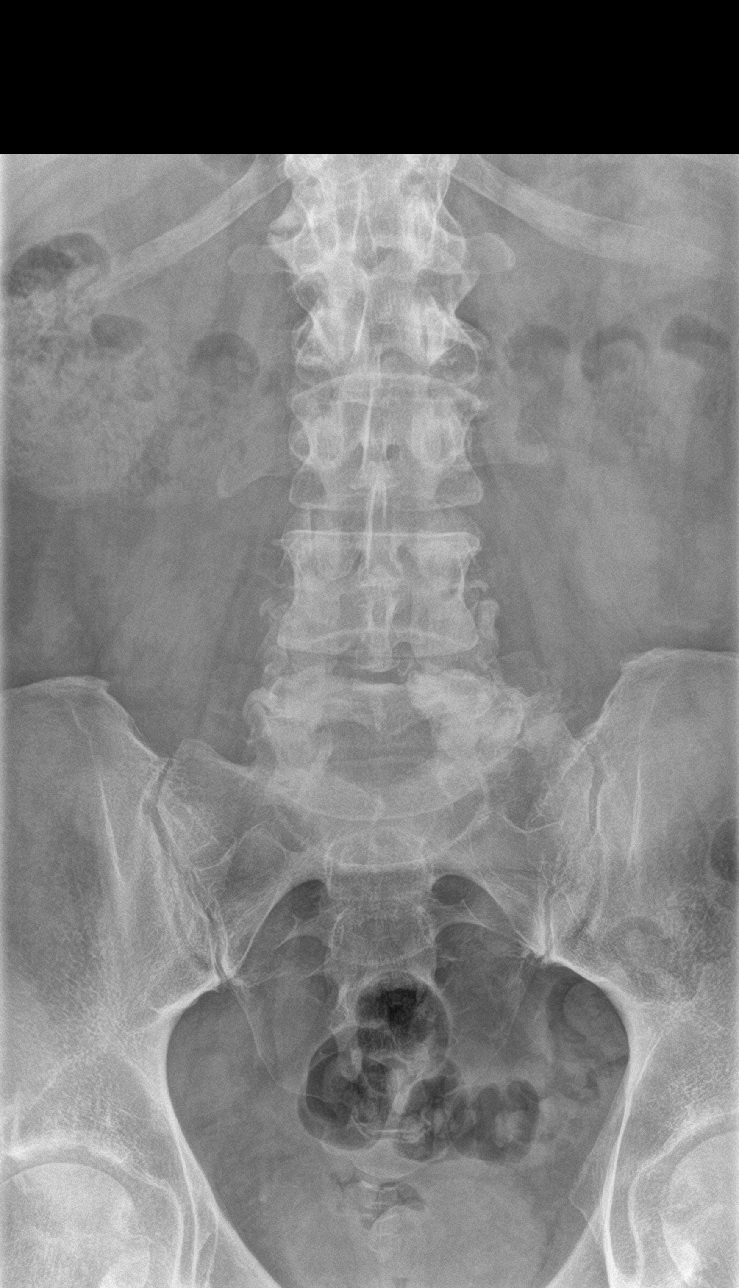
[im 2/5]
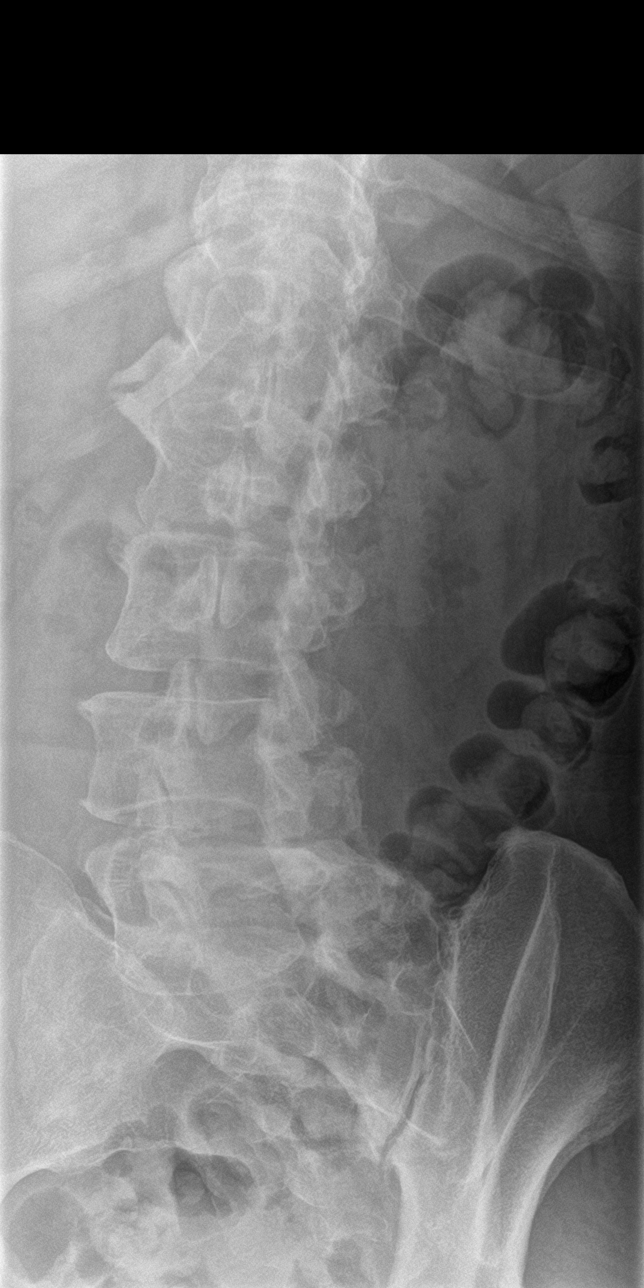
[im 3/5]
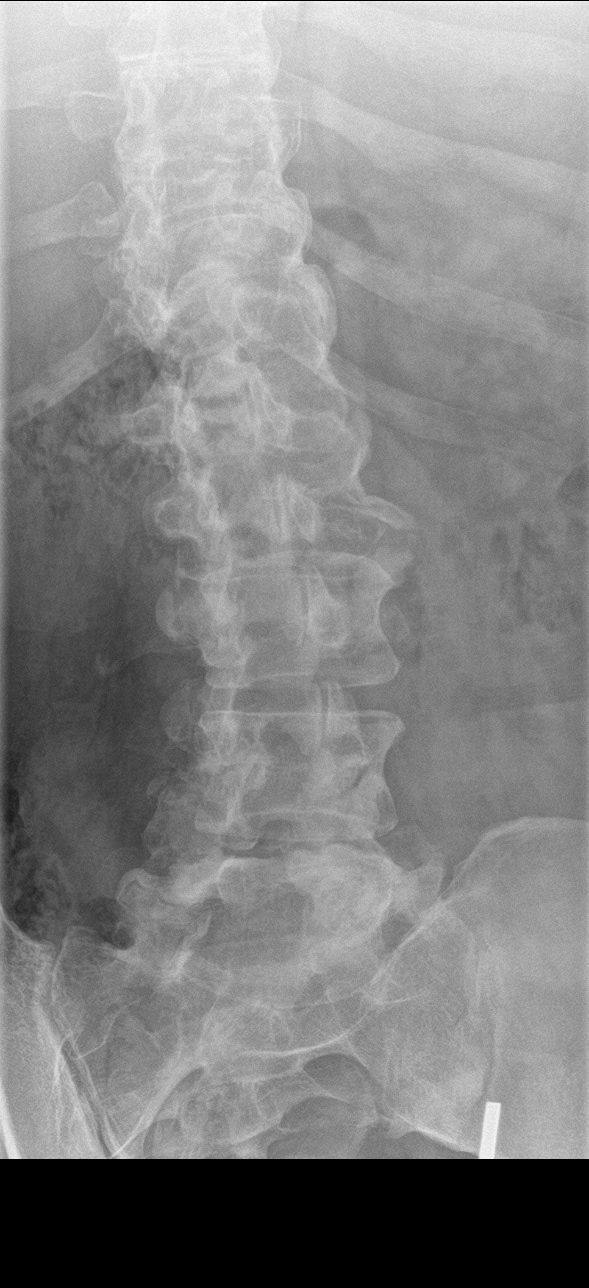
[im 4/5]
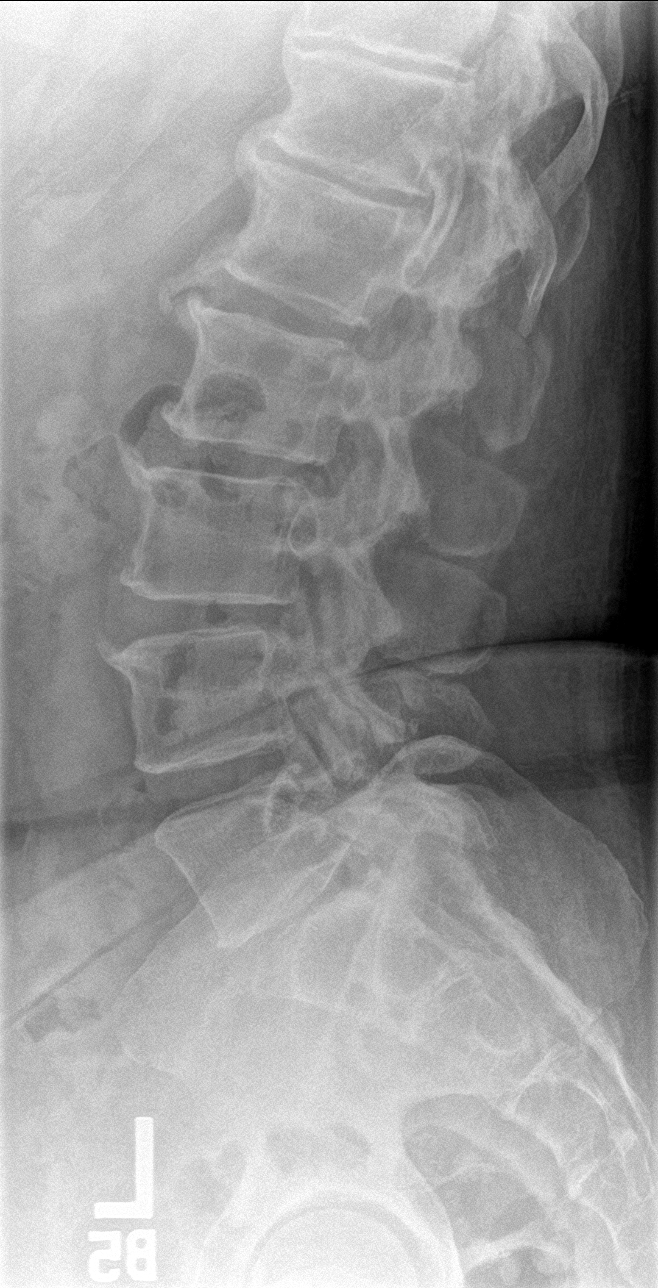
[im 5/5]
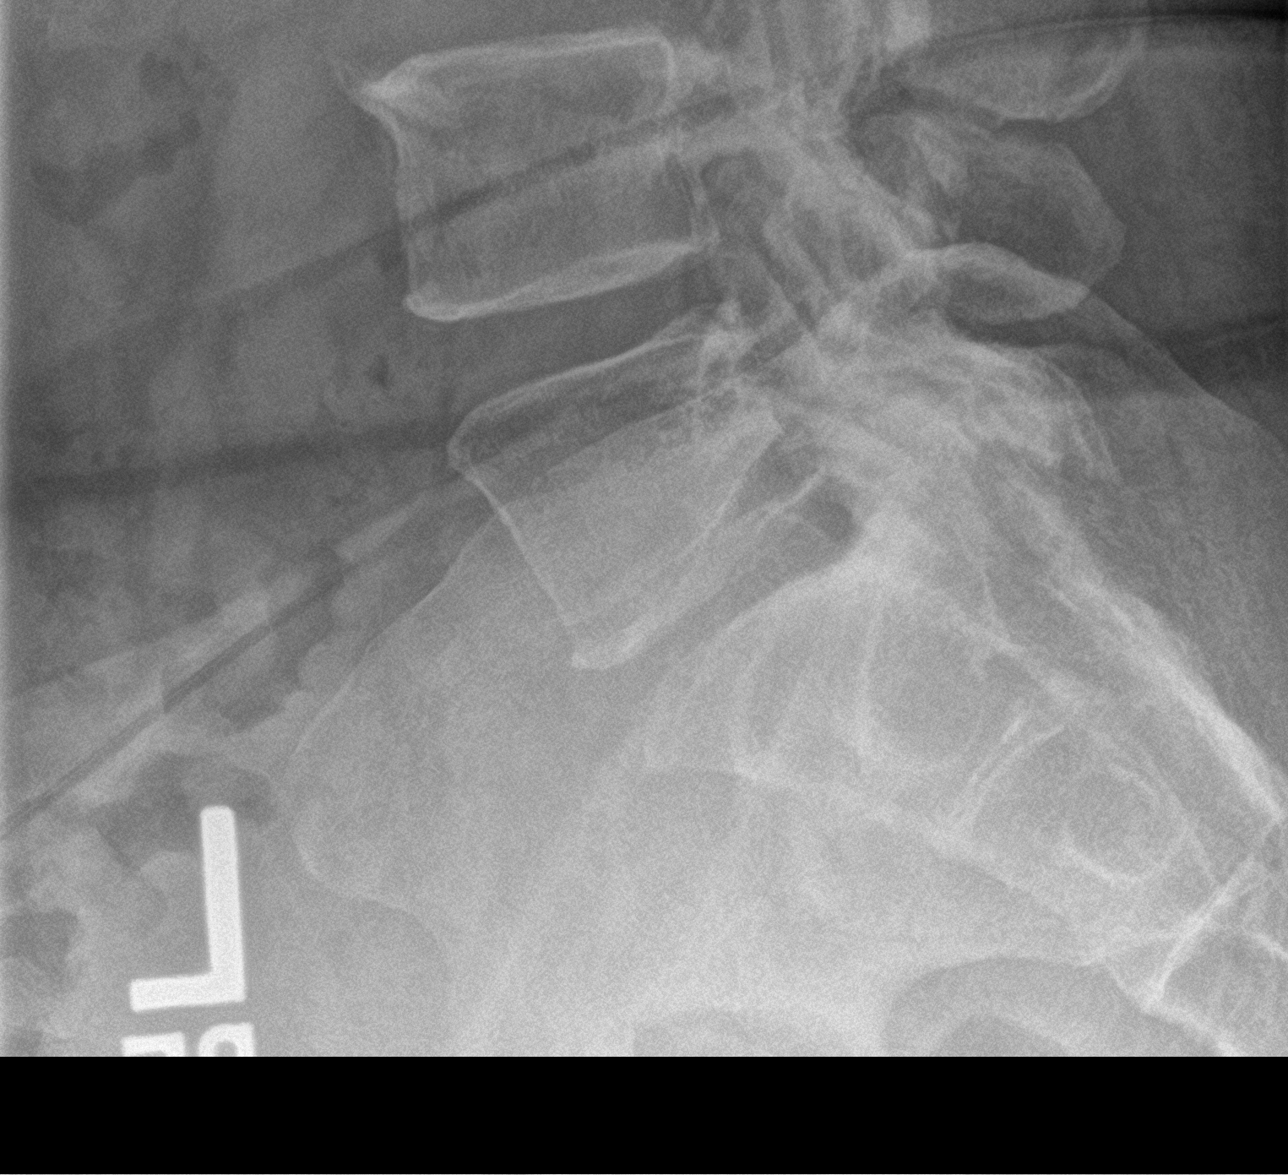

[5 of 5 positions shown; findings below may reference images not displayed]

FINDINGS: Asymmetric left-sided facet hypertrophy and spurring is present at
L5-S1 in to lesser extent at L4-5. This could contribute to
left-sided radiculopathy.

Vertebral body heights are normal. Slight retrolisthesis is present
at L3-4. AP alignment is otherwise anatomic. Soft tissues are
unremarkable.
IMPRESSION: 1. Asymmetric left-sided facet disease at L4-5 and L5-S1 could
contribute to radicular symptoms.

## 2019-04-27 ENCOUNTER — Telehealth: Payer: Self-pay | Admitting: Gerontology

## 2019-04-27 NOTE — Telephone Encounter (Signed)
Left VM 9/29 @ 3:02 PM. Asked pt to call back and schedule an appointment and bring in updated eligibility docs.

## 2019-04-28 ENCOUNTER — Telehealth: Payer: Self-pay

## 2019-05-05 ENCOUNTER — Ambulatory Visit: Payer: PRIVATE HEALTH INSURANCE | Admitting: Gerontology

## 2019-05-05 ENCOUNTER — Ambulatory Visit: Payer: PRIVATE HEALTH INSURANCE

## 2019-05-10 ENCOUNTER — Telehealth: Payer: Self-pay

## 2019-05-10 NOTE — Telephone Encounter (Signed)
TC to patient.  Discussed past HPV + pap and need for repeat.  Appt scheduled Aileen Fass, RN

## 2019-05-12 ENCOUNTER — Ambulatory Visit: Payer: PRIVATE HEALTH INSURANCE | Admitting: Gerontology

## 2019-05-18 DIAGNOSIS — Z8679 Personal history of other diseases of the circulatory system: Secondary | ICD-10-CM

## 2019-05-19 ENCOUNTER — Ambulatory Visit: Payer: Self-pay

## 2019-09-08 ENCOUNTER — Ambulatory Visit: Payer: Self-pay

## 2019-10-08 ENCOUNTER — Other Ambulatory Visit: Payer: Self-pay | Admitting: Gerontology

## 2019-10-08 DIAGNOSIS — E119 Type 2 diabetes mellitus without complications: Secondary | ICD-10-CM

## 2020-02-24 ENCOUNTER — Ambulatory Visit: Payer: Self-pay | Admitting: Nurse Practitioner

## 2020-02-24 ENCOUNTER — Other Ambulatory Visit: Payer: Self-pay

## 2020-02-24 DIAGNOSIS — Z021 Encounter for pre-employment examination: Secondary | ICD-10-CM

## 2020-02-24 LAB — POC COVID19 BINAXNOW: SARS Coronavirus 2 Ag: NEGATIVE

## 2020-11-03 ENCOUNTER — Encounter: Payer: PRIVATE HEALTH INSURANCE | Admitting: Certified Nurse Midwife

## 2020-11-23 ENCOUNTER — Encounter: Payer: No Typology Code available for payment source | Admitting: Certified Nurse Midwife

## 2020-12-01 ENCOUNTER — Encounter: Payer: BC Managed Care – PPO | Admitting: Certified Nurse Midwife

## 2020-12-04 ENCOUNTER — Encounter: Payer: Self-pay | Admitting: Certified Nurse Midwife

## 2020-12-18 ENCOUNTER — Telehealth: Payer: 59 | Admitting: Medical

## 2020-12-18 ENCOUNTER — Other Ambulatory Visit: Payer: Self-pay

## 2020-12-18 NOTE — Progress Notes (Signed)
   Subjective:    Patient ID: Sheila Hicks, female    DOB: 20-Jul-1976, 45 y.o.   MRN: [id number]  HPI 45 yo female in non acute distress, consents to telemedicine appointment. States she has had what she thinks is asthma x 2 weeks. No fever or chills. Last night had an episode with shortness of breath, and cough and chest pain, then got really weak. She would like an Albuterol MDI, though she has never been previously prescribed but thought it might help her cough and chest tightness..  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  She states she has had trouble with allergies this year.Took allergy pill in the past , not sure if it had the decongestant, but her blood pressure significantly increased so she stopped it..   Past Medical History:  Diagnosis Date  . Arrhythmia   . Diabetes mellitus without complication ([place])   . Heart murmur    LBBB  . History of depression   . History of hay fever   . History of hypertension   . Hypertension    No Known Allergies   Review of Systems  Constitutional: Positive for fatigue.  Respiratory: Positive for cough, chest tightness and shortness of breath.   Cardiovascular: Positive for chest pain.  Neurological: Positive for weakness.       Objective:   Physical Exam Recommended she see her PCP she already has an appointment for Wednesday with Eda Paschal..       Assessment & Plan:  Cough, Shortness of breath , chest tightness, weakness Given some differential diagnosis of what could be going on with her body. Recommend she go to the Emergency Department for further evaluation. She states she still feels very weak and chest is tight. Patient verbalizes understanding , She should not drive herself either a friend or call 911.  She states someone will take her to the ED.

## 2020-12-26 ENCOUNTER — Telehealth: Payer: Self-pay | Admitting: Medical

## 2020-12-26 NOTE — Telephone Encounter (Signed)
Patient seen by her PCPs Perfered Primary Care office Eda Paschal) however patients regular provider was not in clinic so she saw Karalee Height. Did spirometry test and then a nebulizer treatment , rechecked on spirometry and she felt much  Patient states she was diagnosed with exercised induced Asthma. Placed on Breo daily and also prescribed Albuterol as a rescue inhaler. Patient states she is feeling much better. She is thankful for our care. Return to the clinic as needed.

## 2021-03-27 ENCOUNTER — Ambulatory Visit: Payer: BC Managed Care – PPO | Admitting: Medical

## 2021-03-27 ENCOUNTER — Other Ambulatory Visit: Payer: Self-pay

## 2021-03-27 DIAGNOSIS — R059 Cough, unspecified: Secondary | ICD-10-CM

## 2021-03-27 MED ORDER — BENZONATATE 100 MG PO CAPS: 100.0000 mg | ORAL_CAPSULE | Freq: Two times a day (BID) | ORAL | 0 refills | Status: DC | PRN

## 2021-03-27 NOTE — Progress Notes (Signed)
   Subjective:    Patient ID: Sheila Hicks, female    DOB: 1976-04-01, 45 y.o.   MRN: [id number]  HPI 45 yo female in non acute distress, consents to telemedicine appointment. Tested  Covid-19 positive with point of care home test on Saturday, deemed a close contact by HR yesterday. Her symptom(s) started on Tuesday August 23rd 2022, symptoms of nasal congestion and cough. She feels better with all her symptoms improving.and no fever x 24 hours with no OTC Motrin or Tylenol being taken.  Review of Systems  Constitutional:  Negative for chills, fatigue and fever.  HENT:  Positive for congestion (better now) and sneezing (at time of test on Saturday). Negative for ear pain, sinus pressure, sinus pain and sore throat.   Respiratory:  Positive for cough (non productive now). Negative for shortness of breath.   Cardiovascular:  Negative for chest pain.  Gastrointestinal:  Negative for abdominal pain and diarrhea.  Musculoskeletal:  Negative for back pain and myalgias.  Skin:  Negative for color change.  Allergic/Immunologic: Positive for environmental allergies.  Neurological:  Negative for dizziness, syncope, facial asymmetry and headaches.  Hematological:  Negative for adenopathy.      Objective:   Physical Exam  AXOX3 No distress on phone call No physical performed due to telemedicine appointment.      Assessment & Plan:  Covid-19 infection  Cough Okay to return to work today . Meds ordered this encounter  Medications   benzonatate (TESSALON) 100 MG capsule    Sig: Take 1 capsule (100 mg total) by mouth 2 (two) times daily as needed for cough.    Dispense:  20 capsule    Refill:  0   Return to clinic or call if any concerns. Patient verbalizes understanding and has no questions at the end of our conversation.

## 2021-03-27 NOTE — Patient Instructions (Signed)

## 2021-08-02 ENCOUNTER — Emergency Department: Payer: BC Managed Care – PPO

## 2021-08-02 ENCOUNTER — Inpatient Hospital Stay
Admission: EM | Admit: 2021-08-02 | Discharge: 2021-08-04 | DRG: 871 | Disposition: A | Discharge status: Home / Self Care | Payer: BC Managed Care – PPO | Attending: Internal Medicine | Admitting: Internal Medicine

## 2021-08-02 ENCOUNTER — Other Ambulatory Visit: Payer: Self-pay

## 2021-08-02 DIAGNOSIS — Z20822 Contact with and (suspected) exposure to covid-19: Secondary | ICD-10-CM | POA: Diagnosis present

## 2021-08-02 DIAGNOSIS — Z791 Long term (current) use of non-steroidal anti-inflammatories (NSAID): Secondary | ICD-10-CM

## 2021-08-02 DIAGNOSIS — R9431 Abnormal electrocardiogram [ECG] [EKG]: Secondary | ICD-10-CM | POA: Diagnosis present

## 2021-08-02 DIAGNOSIS — J9601 Acute respiratory failure with hypoxia: Secondary | ICD-10-CM | POA: Diagnosis present

## 2021-08-02 DIAGNOSIS — R6510 Systemic inflammatory response syndrome (SIRS) of non-infectious origin without acute organ dysfunction: Secondary | ICD-10-CM

## 2021-08-02 DIAGNOSIS — Z833 Family history of diabetes mellitus: Secondary | ICD-10-CM

## 2021-08-02 DIAGNOSIS — I10 Essential (primary) hypertension: Secondary | ICD-10-CM | POA: Diagnosis present

## 2021-08-02 DIAGNOSIS — J189 Pneumonia, unspecified organism: Secondary | ICD-10-CM | POA: Diagnosis present

## 2021-08-02 DIAGNOSIS — Z6841 Body Mass Index (BMI) 40.0 and over, adult: Secondary | ICD-10-CM | POA: Diagnosis not present

## 2021-08-02 DIAGNOSIS — E872 Acidosis, unspecified: Secondary | ICD-10-CM | POA: Diagnosis present

## 2021-08-02 DIAGNOSIS — E1165 Type 2 diabetes mellitus with hyperglycemia: Secondary | ICD-10-CM | POA: Diagnosis present

## 2021-08-02 DIAGNOSIS — Z8249 Family history of ischemic heart disease and other diseases of the circulatory system: Secondary | ICD-10-CM | POA: Diagnosis not present

## 2021-08-02 DIAGNOSIS — I447 Left bundle-branch block, unspecified: Secondary | ICD-10-CM | POA: Diagnosis present

## 2021-08-02 DIAGNOSIS — R7989 Other specified abnormal findings of blood chemistry: Secondary | ICD-10-CM | POA: Diagnosis present

## 2021-08-02 DIAGNOSIS — E782 Mixed hyperlipidemia: Secondary | ICD-10-CM | POA: Diagnosis present

## 2021-08-02 DIAGNOSIS — R0602 Shortness of breath: Secondary | ICD-10-CM | POA: Diagnosis not present

## 2021-08-02 DIAGNOSIS — Z7984 Long term (current) use of oral hypoglycemic drugs: Secondary | ICD-10-CM | POA: Diagnosis not present

## 2021-08-02 DIAGNOSIS — A419 Sepsis, unspecified organism: Principal | ICD-10-CM | POA: Diagnosis present

## 2021-08-02 DIAGNOSIS — Z79899 Other long term (current) drug therapy: Secondary | ICD-10-CM | POA: Diagnosis not present

## 2021-08-02 DIAGNOSIS — E876 Hypokalemia: Secondary | ICD-10-CM | POA: Diagnosis present

## 2021-08-02 DIAGNOSIS — Z7951 Long term (current) use of inhaled steroids: Secondary | ICD-10-CM | POA: Diagnosis not present

## 2021-08-02 DIAGNOSIS — J45901 Unspecified asthma with (acute) exacerbation: Secondary | ICD-10-CM | POA: Diagnosis present

## 2021-08-02 DIAGNOSIS — J4521 Mild intermittent asthma with (acute) exacerbation: Secondary | ICD-10-CM | POA: Diagnosis not present

## 2021-08-02 LAB — CBC WITH DIFFERENTIAL/PLATELET
Abs Immature Granulocytes: 0.08 10*3/uL — ABNORMAL HIGH (ref 0.00–0.07)
Basophils Absolute: 0 10*3/uL (ref 0.0–0.1)
Basophils Relative: 0 %
Eosinophils Absolute: 0.1 10*3/uL (ref 0.0–0.5)
Eosinophils Relative: 1 %
HCT: 44.3 % (ref 36.0–46.0)
Hemoglobin: 14.8 g/dL (ref 12.0–15.0)
Immature Granulocytes: 1 %
Lymphocytes Relative: 17 %
Lymphs Abs: 1.9 10*3/uL (ref 0.7–4.0)
MCH: 29.5 pg (ref 26.0–34.0)
MCHC: 33.4 g/dL (ref 30.0–36.0)
MCV: 88.2 fL (ref 80.0–100.0)
Monocytes Absolute: 0.6 10*3/uL (ref 0.1–1.0)
Monocytes Relative: 6 %
Neutro Abs: 8.4 10*3/uL — ABNORMAL HIGH (ref 1.7–7.7)
Neutrophils Relative %: 75 %
Platelets: 239 10*3/uL (ref 150–400)
RBC: 5.02 MIL/uL (ref 3.87–5.11)
RDW: 13.1 % (ref 11.5–15.5)
WBC: 11.1 10*3/uL — ABNORMAL HIGH (ref 4.0–10.5)
nRBC: 0 % (ref 0.0–0.2)

## 2021-08-02 LAB — COMPREHENSIVE METABOLIC PANEL
ALT: 21 U/L (ref 0–44)
AST: 27 U/L (ref 15–41)
Albumin: 3.6 g/dL (ref 3.5–5.0)
Alkaline Phosphatase: 42 U/L (ref 38–126)
Anion gap: 10 (ref 5–15)
BUN: 13 mg/dL (ref 6–20)
CO2: 25 mmol/L (ref 22–32)
Calcium: 8.7 mg/dL — ABNORMAL LOW (ref 8.9–10.3)
Chloride: 100 mmol/L (ref 98–111)
Creatinine, Ser: 0.79 mg/dL (ref 0.44–1.00)
GFR, Estimated: >60 mL/min (ref >60)
Glucose, Bld: 246 mg/dL — ABNORMAL HIGH (ref 70–99)
Potassium: 3.2 mmol/L — ABNORMAL LOW (ref 3.5–5.1)
Sodium: 135 mmol/L (ref 135–145)
Total Bilirubin: 1.1 mg/dL (ref 0.3–1.2)
Total Protein: 7.6 g/dL (ref 6.5–8.1)

## 2021-08-02 LAB — URINALYSIS, COMPLETE (UACMP) WITH MICROSCOPIC
Bacteria, UA: NONE SEEN
Bilirubin Urine: NEGATIVE
Glucose, UA: >=500 mg/dL — AB
Hgb urine dipstick: NEGATIVE
Ketones, ur: NEGATIVE mg/dL
Nitrite: NEGATIVE
Protein, ur: NEGATIVE mg/dL
Specific Gravity, Urine: 1.029 (ref 1.005–1.030)
pH: 5 (ref 5.0–8.0)

## 2021-08-02 LAB — BLOOD GAS, ARTERIAL
Acid-base deficit: 2.2 mmol/L — ABNORMAL HIGH (ref 0.0–2.0)
Bicarbonate: 21.2 mmol/L (ref 20.0–28.0)
FIO2: 0.21
O2 Saturation: 94.7 %
Patient temperature: 37
pCO2 arterial: 32 mmHg (ref 32.0–48.0)
pH, Arterial: 7.43 (ref 7.350–7.450)
pO2, Arterial: 72 mmHg — ABNORMAL LOW (ref 83.0–108.0)

## 2021-08-02 LAB — HCG, QUANTITATIVE, PREGNANCY: hCG, Beta Chain, Quant, S: <1 m[IU]/mL (ref <5)

## 2021-08-02 LAB — TROPONIN I (HIGH SENSITIVITY)
Troponin I (High Sensitivity): 19 ng/L — ABNORMAL HIGH (ref <18)
Troponin I (High Sensitivity): 18 ng/L — ABNORMAL HIGH (ref <18)

## 2021-08-02 LAB — BLOOD GAS, VENOUS
Acid-Base Excess: 2.7 mmol/L — ABNORMAL HIGH (ref 0.0–2.0)
Bicarbonate: 27.9 mmol/L (ref 20.0–28.0)
O2 Saturation: 86.1 %
Patient temperature: 37
pCO2, Ven: 44 mmHg (ref 44.0–60.0)
pH, Ven: 7.41 (ref 7.250–7.430)
pO2, Ven: 51 mmHg — ABNORMAL HIGH (ref 32.0–45.0)

## 2021-08-02 LAB — STREP PNEUMONIAE URINARY ANTIGEN: Strep Pneumo Urinary Antigen: NEGATIVE

## 2021-08-02 LAB — BRAIN NATRIURETIC PEPTIDE: B Natriuretic Peptide: 109.5 pg/mL — ABNORMAL HIGH (ref 0.0–100.0)

## 2021-08-02 LAB — APTT: aPTT: 31 seconds (ref 24–36)

## 2021-08-02 LAB — PROCALCITONIN: Procalcitonin: <0.1 ng/mL

## 2021-08-02 LAB — RESP PANEL BY RT-PCR (FLU A&B, COVID) ARPGX2
Influenza A by PCR: NEGATIVE
Influenza B by PCR: NEGATIVE
SARS Coronavirus 2 by RT PCR: NEGATIVE

## 2021-08-02 LAB — LACTIC ACID, PLASMA
Lactic Acid, Venous: 3.7 mmol/L (ref 0.5–1.9)
Lactic Acid, Venous: 7.7 mmol/L (ref 0.5–1.9)

## 2021-08-02 LAB — CBG MONITORING, ED: Glucose-Capillary: 315 mg/dL — ABNORMAL HIGH (ref 70–99)

## 2021-08-02 LAB — D-DIMER, QUANTITATIVE: D-Dimer, Quant: 1.28 ug/mL-FEU — ABNORMAL HIGH (ref 0.00–0.50)

## 2021-08-02 LAB — PROTIME-INR
INR: 1 (ref 0.8–1.2)
Prothrombin Time: 12.8 seconds (ref 11.4–15.2)

## 2021-08-02 LAB — HIV ANTIBODY (ROUTINE TESTING W REFLEX): HIV Screen 4th Generation wRfx: NONREACTIVE

## 2021-08-02 LAB — MAGNESIUM: Magnesium: 1.3 mg/dL — ABNORMAL LOW (ref 1.7–2.4)

## 2021-08-02 MED ORDER — IPRATROPIUM-ALBUTEROL 0.5-2.5 (3) MG/3ML IN SOLN
9.0000 mL | Freq: Once | RESPIRATORY_TRACT | Status: AC
  Administered 2021-08-02: 9 mL via RESPIRATORY_TRACT
  Filled 2021-08-02: qty 9

## 2021-08-02 MED ORDER — MAGNESIUM SULFATE 2 GM/50ML IV SOLN
2.0000 g | Freq: Once | INTRAVENOUS | Status: AC
  Administered 2021-08-03: 2 g via INTRAVENOUS
  Filled 2021-08-02: qty 50

## 2021-08-02 MED ORDER — ENOXAPARIN SODIUM 80 MG/0.8ML IJ SOSY
0.5000 mg/kg | PREFILLED_SYRINGE | INTRAMUSCULAR | Status: DC
  Administered 2021-08-02 – 2021-08-03 (×2): 62.5 mg via SUBCUTANEOUS
  Filled 2021-08-02 (×2): qty 0.63

## 2021-08-02 MED ORDER — MAGNESIUM SULFATE 4 GM/100ML IV SOLN
4.0000 g | Freq: Once | INTRAVENOUS | Status: AC
  Administered 2021-08-02: 4 g via INTRAVENOUS
  Filled 2021-08-02: qty 100

## 2021-08-02 MED ORDER — SODIUM CHLORIDE 0.9 % IV SOLN
2.0000 g | INTRAVENOUS | Status: DC
  Administered 2021-08-03 – 2021-08-04 (×2): 2 g via INTRAVENOUS
  Filled 2021-08-02 (×2): qty 20

## 2021-08-02 MED ORDER — METHYLPREDNISOLONE SODIUM SUCC 125 MG IJ SOLR
80.0000 mg | Freq: Every day | INTRAMUSCULAR | Status: DC
  Administered 2021-08-03 – 2021-08-04 (×2): 80 mg via INTRAVENOUS
  Filled 2021-08-02 (×2): qty 2

## 2021-08-02 MED ORDER — ALBUTEROL SULFATE HFA 108 (90 BASE) MCG/ACT IN AERS
2.0000 | INHALATION_SPRAY | RESPIRATORY_TRACT | Status: DC | PRN
  Filled 2021-08-02: qty 6.7

## 2021-08-02 MED ORDER — SODIUM CHLORIDE 0.9 % IV SOLN: 500.0000 mg | INTRAVENOUS | Status: DC

## 2021-08-02 MED ORDER — SODIUM CHLORIDE 0.9 % IV SOLN: INTRAVENOUS | Status: AC

## 2021-08-02 MED ORDER — INSULIN ASPART 100 UNIT/ML IJ SOLN
0.0000 [IU] | Freq: Three times a day (TID) | INTRAMUSCULAR | Status: DC
  Administered 2021-08-03: 7 [IU] via SUBCUTANEOUS
  Administered 2021-08-03 (×2): 11 [IU] via SUBCUTANEOUS
  Administered 2021-08-04: 7 [IU] via SUBCUTANEOUS
  Administered 2021-08-04: 13:00:00 11 [IU] via SUBCUTANEOUS
  Filled 2021-08-02 (×5): qty 1

## 2021-08-02 MED ORDER — SODIUM CHLORIDE 0.9 % IV SOLN: Freq: Once | INTRAVENOUS | Status: AC

## 2021-08-02 MED ORDER — INSULIN ASPART 100 UNIT/ML IJ SOLN
0.0000 [IU] | Freq: Every day | INTRAMUSCULAR | Status: DC
  Administered 2021-08-02 – 2021-08-03 (×2): 4 [IU] via SUBCUTANEOUS
  Filled 2021-08-02 (×2): qty 1

## 2021-08-02 MED ORDER — IPRATROPIUM-ALBUTEROL 0.5-2.5 (3) MG/3ML IN SOLN
3.0000 mL | Freq: Once | RESPIRATORY_TRACT | Status: AC
  Administered 2021-08-02: 3 mL via RESPIRATORY_TRACT

## 2021-08-02 MED ORDER — DM-GUAIFENESIN ER 30-600 MG PO TB12
1.0000 | ORAL_TABLET | Freq: Two times a day (BID) | ORAL | Status: DC
  Administered 2021-08-02 – 2021-08-04 (×4): 1 via ORAL
  Filled 2021-08-02 (×4): qty 1

## 2021-08-02 MED ORDER — PANTOPRAZOLE SODIUM 40 MG PO TBEC
40.0000 mg | DELAYED_RELEASE_TABLET | Freq: Every day | ORAL | Status: DC
  Administered 2021-08-02 – 2021-08-04 (×3): 40 mg via ORAL
  Filled 2021-08-02 (×3): qty 1

## 2021-08-02 MED ORDER — LACTATED RINGERS IV SOLN: INTRAVENOUS | Status: DC

## 2021-08-02 MED ORDER — AZITHROMYCIN 500 MG IV SOLR
500.0000 mg | Freq: Once | INTRAVENOUS | Status: AC
  Administered 2021-08-02: 500 mg via INTRAVENOUS
  Filled 2021-08-02: qty 5

## 2021-08-02 MED ORDER — IOHEXOL 350 MG/ML SOLN
75.0000 mL | Freq: Once | INTRAVENOUS | Status: AC | PRN
  Administered 2021-08-02: 75 mL via INTRAVENOUS
  Filled 2021-08-02: qty 75

## 2021-08-02 MED ORDER — LACTATED RINGERS IV BOLUS
1000.0000 mL | Freq: Once | INTRAVENOUS | Status: AC
  Administered 2021-08-02: 1000 mL via INTRAVENOUS

## 2021-08-02 MED ORDER — ACETAMINOPHEN 650 MG RE SUPP: 650.0000 mg | Freq: Four times a day (QID) | RECTAL | Status: DC | PRN

## 2021-08-02 MED ORDER — IPRATROPIUM BROMIDE 0.02 % IN SOLN
0.5000 mg | Freq: Four times a day (QID) | RESPIRATORY_TRACT | Status: DC
  Administered 2021-08-02 – 2021-08-04 (×5): 0.5 mg via RESPIRATORY_TRACT
  Filled 2021-08-02 (×5): qty 2.5

## 2021-08-02 MED ORDER — ACETAMINOPHEN 325 MG PO TABS: 650.0000 mg | ORAL_TABLET | Freq: Four times a day (QID) | ORAL | Status: DC | PRN

## 2021-08-02 MED ORDER — DOXYCYCLINE HYCLATE 100 MG PO TABS
100.0000 mg | ORAL_TABLET | Freq: Two times a day (BID) | ORAL | Status: DC
  Administered 2021-08-03 – 2021-08-04 (×3): 100 mg via ORAL
  Filled 2021-08-02 (×3): qty 1

## 2021-08-02 MED ORDER — POTASSIUM CHLORIDE CRYS ER 20 MEQ PO TBCR
40.0000 meq | EXTENDED_RELEASE_TABLET | Freq: Once | ORAL | Status: AC
  Administered 2021-08-02: 40 meq via ORAL
  Filled 2021-08-02: qty 2

## 2021-08-02 MED ORDER — SODIUM CHLORIDE 0.9 % IV SOLN
1.0000 g | Freq: Once | INTRAVENOUS | Status: AC
  Administered 2021-08-02: 1 g via INTRAVENOUS
  Filled 2021-08-02: qty 10

## 2021-08-02 MED ORDER — METHYLPREDNISOLONE SODIUM SUCC 125 MG IJ SOLR
125.0000 mg | Freq: Once | INTRAMUSCULAR | Status: AC
  Administered 2021-08-02: 125 mg via INTRAVENOUS
  Filled 2021-08-02: qty 2

## 2021-08-02 NOTE — H&P (Addendum)
History and Physical  Sheila Hicks IRS:[id number] DOB: [birth year] DOA: 08/02/2021  Referring physician: Lucrezia Starch, MD PCP: Gae Bon, NP  Patient coming from: Home  Chief Complaint: Shortness of breath  HPI: Sheila Hicks is a 46 y.o. female with medical history significant for asthma (sports induced), T2DM, LBBB, morbid obesity, hyperlipidemia, hypertension who presents to the emergency department accompanied by a friend due to sudden onset of shortness of breath which started shortly after she started to exercise, chief complaint of wheezing, coughing with production of clear sputum.  She used home inhaler albuterol and took about 12 puffs within 1 hour without improvement, she then decided to call her friend who brought her to the ED for further evaluation and management.  She denies fever, chills, chest pain, headache, nausea, vomiting, abdominal pain, diarrhea or constipation.  She reports taking Lasix at home due to increased leg swelling this week.  ED Course:  In the emergency department, she was tachycardic, but other vital signs were within normal range.  She was in respiratory and could not complete a sentence without being short of breath, so oxygen was provided (patient does not use oxygen at baseline).  Work-up in the ED showed normal CBC except leukocytosis, BMP showed hypokalemia and hyperglycemia, lactic acid 3.7 . 7.7, D-dimer 1.28, procalcitonin < 0.10.  Influenza A, B, SARS coronavirus 2 was negative.  Urinalysis was unimpressive for UTI.  BNP 109.5 Chest x-ray showed bibasilar airspace disease left greater than right.  Possible pneumonia or atelectasis CT angiography chest with contrast showed Multifactorial degradation, including motion, patient body habitus, and suboptimal bolus timing. No central/saddle pulmonary embolism. Other emboli cannot be excluded. Trace bilateral pleural effusions with left greater than right ground-glass opacity. Pulmonary  edema is slightly favored. Atypical infection could look similar. Patient was started on IV ceftriaxone and azithromycin, breathing treatment was provided, IV hydration was started.  Hospitalist was asked to admit patient for further evaluation and management.  Review of Systems: A full 10 point Review of Systems was done, except as stated above, all other Review of systems were negative.  Past Medical History:  Diagnosis Date   Arrhythmia    Diabetes mellitus without complication ([place])    Heart murmur    LBBB   History of depression    History of hay fever    History of hypertension    Hypertension    History reviewed. No pertinent surgical history.  Social History:  reports that she has never smoked. She has never used smokeless tobacco. She reports that she does not drink alcohol and does not use drugs.   No Known Allergies  Family History  Problem Relation Age of Onset   Hypertension Father    Diabetes Father    Hypertension Mother    Diabetes Mother    Hypertension Maternal Grandmother    Diabetes Maternal Grandmother    Hypertension Paternal Grandmother    Diabetes Paternal Grandmother    Breast cancer Neg Hx    Ovarian cancer Neg Hx    Colon cancer Neg Hx      Prior to Admission medications   Medication Sig Start Date End Date Taking? Authorizing Provider  albuterol (VENTOLIN HFA) 108 (90 Base) MCG/ACT inhaler Inhale 1-2 puffs into the lungs every 6 (six) hours as needed for wheezing or shortness of breath.   Yes [provider]  Ascorbic Acid (VITAMIN C) 1000 MG tablet Take 1,000 mg by mouth daily.   Yes [provider]  atorvastatin (LIPITOR) 20 MG tablet Take 20 mg by mouth daily. 03/06/21  Yes [provider]  BREO ELLIPTA 200-25 MCG/ACT AEPB Inhale 1 puff into the lungs daily. 07/23/21  Yes [provider]  furosemide (LASIX) 20 MG tablet Take 1 tablet (20 mg total) by mouth 2 (two) times daily. 02/12/19 08/02/21 Yes Lavonia Drafts, MD  lisinopril (ZESTRIL) 20 MG tablet Take 20 mg by mouth daily. 03/02/21  Yes [provider]  meloxicam (MOBIC) 15 MG tablet Take 1 tablet (15 mg total) by mouth daily. 07/23/18  Yes Tukov-Yual, Arlyss Gandy, NP  Multiple Vitamin (MULTIVITAMIN WITH MINERALS) TABS tablet Take 1 tablet by mouth daily. 03/26/18  Yes Tukov-Yual, Magdalene S, NP  benzonatate (TESSALON) 100 MG capsule Take 1 capsule (100 mg total) by mouth 2 (two) times daily as needed for cough. Patient not taking: Reported on 08/02/2021 03/27/21   Daryll Drown R, PA-C  cyclobenzaprine (FLEXERIL) 5 MG tablet Take 1 tablet (5 mg total) by mouth at bedtime as needed for muscle spasms. Patient not taking: Reported on 01/28/2019 12/10/18   Doles-Johnson, Teah, NP  ferrous sulfate 325 (65 FE) MG tablet Take 1 tablet (325 mg total) by mouth 2 (two) times daily with a meal. Patient not taking: Reported on 08/02/2021 07/16/18   Tukov-Yual, Arlyss Gandy, NP  gabapentin (NEURONTIN) 100 MG capsule Take 1 capsule (100 mg total) by mouth 3 (three) times daily. Patient not taking: Reported on 01/28/2019 03/26/18   Erlene Quan, NP  glipiZIDE (GLUCOTROL XL) 2.5 MG 24 hr tablet Take 1 tablet (2.5 mg total) by mouth daily with breakfast. Patient not taking: Reported on 08/02/2021 12/10/18   Doles-Johnson, Teah, NP  lisinopril-hydrochlorothiazide (ZESTORETIC) 20-12.5 MG tablet Take 2 tablets by mouth daily. Patient not taking: Reported on 08/02/2021 12/23/18   Erlene Quan, NP  Melatonin 5 MG TABS Take 1 tablet (5 mg total) by mouth at bedtime. Patient not taking: Reported on 08/02/2021 03/26/18   Erlene Quan, NP  omega-3 acid ethyl esters (LOVAZA) 1 g capsule Take 1 capsule (1 g total) by mouth daily. Patient not taking: Reported on 08/02/2021 03/26/18   Erlene Quan, NP    Physical Exam: BP 117/64 (BP Location: Right Arm)    Pulse (!) 113    Temp 97.9 F (36.6 C) (Oral)    Resp 20    Ht 5\' 8"  (1.727 m)     Wt 127 kg    LMP 04/27/2021    SpO2 94%    BMI 42.57 kg/m   General: 46 y.o. year-old female well developed well nourished in no acute distress.  Alert and oriented x3. HEENT: NCAT, EOMI Neck: Supple, trachea medial Cardiovascular: Tachycardia.  Regular rate and rhythm with no rubs or gallops.  No thyromegaly or JVD noted.  No lower extremity edema. 2/4 pulses in all 4 extremities. Respiratory: Diffuse rhonchi on auscultation with minimal wheezes.  Abdomen: Soft, nontender nondistended with normal bowel sounds x4 quadrants. Muskuloskeletal: No cyanosis, clubbing or edema noted bilaterally Neuro: CN II-XII intact, strength 5/5 x 4, sensation, reflexes intact Skin: No ulcerative lesions noted or rashes Psychiatry: Judgement and insight appear normal. Mood is appropriate for condition and setting          Labs on Admission:  Basic Metabolic Panel: Recent Labs  Lab 08/02/21 1126  NA 135  K 3.2*  CL 100  CO2 25  GLUCOSE 246*  BUN 13  CREATININE 0.79  CALCIUM 8.7*   Liver Function Tests:  Recent Labs  Lab 08/02/21 1126  AST 27  ALT 21  ALKPHOS 42  BILITOT 1.1  PROT 7.6  ALBUMIN 3.6   No results for input(s): LIPASE, AMYLASE in the last 168 hours. No results for input(s): AMMONIA in the last 168 hours. CBC: Recent Labs  Lab 08/02/21 1126  WBC 11.1*  NEUTROABS 8.4*  HGB 14.8  HCT 44.3  MCV 88.2  PLT 239   Cardiac Enzymes: No results for input(s): CKTOTAL, CKMB, CKMBINDEX, TROPONINI in the last 168 hours.  BNP (last 3 results) Recent Labs    08/02/21 1130  BNP 109.5*    ProBNP (last 3 results) No results for input(s): PROBNP in the last 8760 hours.  CBG: No results for input(s): GLUCAP in the last 168 hours.  Radiological Exams on Admission: DG Chest 2 View  Result Date: 08/02/2021 CLINICAL DATA:  Short of breath EXAM: CHEST - 2 VIEW COMPARISON:  02/12/2019 FINDINGS: Hypoventilation with decreased lung volume. Heart size upper normal. Vascularity normal.  Patchy bibasilar airspace disease left greater than right. No significant effusion. No focal skeletal abnormality. IMPRESSION: Bibasilar airspace disease left greater than right. Possible pneumonia or atelectasis. Electronically Signed   By: Franchot Gallo M.D.   On: 08/02/2021 11:53   CT Angio Chest PE W and/or Wo Contrast  Result Date: 08/02/2021 CLINICAL DATA:  Pulmonary embolism suspected. High probability. Past medical history includes diabetes. Hypertension. EXAM: CT ANGIOGRAPHY CHEST WITH CONTRAST TECHNIQUE: Multidetector CT imaging of the chest was performed using the standard protocol during bolus administration of intravenous contrast. Multiplanar CT image reconstructions and MIPs were obtained to evaluate the vascular anatomy. CONTRAST:  67mL OMNIPAQUE IOHEXOL 350 MG/ML SOLN COMPARISON:  Chest radiograph of earlier today.  CT 02/12/2019 FINDINGS: Cardiovascular: The quality of this exam for evaluation of pulmonary embolism is poor. Limitations include motion, patient body habitus, and suboptimal bolus timing. Contrast centered in the SVC. No central/saddle pulmonary embolism. No large lobar embolism to the lower lungs. Other emboli cannot be excluded. Aortic atherosclerosis. Moderate cardiomegaly, without pericardial effusion. Mediastinum/Nodes: No mediastinal or hilar adenopathy. Lungs/Pleura: Trace bilateral pleural effusions. Left greater than right, relatively diffuse ground-glass opacity. Upper Abdomen: Hepatic steatosis. Normal imaged portions of the spleen, stomach, pancreas, gallbladder, adrenal glands, right kidney. There may be mild left renal cortical thinning. Motion degradation continues into the upper abdomen. Musculoskeletal: No acute osseous abnormality. Lower thoracic spondylosis. Review of the MIP images confirms the above findings. IMPRESSION: 1. Multifactorial degradation, including motion, patient body habitus, and suboptimal bolus timing. 2. No central/saddle pulmonary embolism.  Other emboli cannot be excluded. 3. Trace bilateral pleural effusions with left greater than right ground-glass opacity. Pulmonary edema is slightly favored. Atypical infection could look similar. 4. Hepatic steatosis. 5. Aortic Atherosclerosis (ICD10-I70.0). Electronically Signed   By: Abigail Miyamoto M.D.   On: 08/02/2021 16:20    EKG: I independently viewed the EKG done and my findings are as followed: Sinus tachycardia at a rate of 110 bpm, LBBB, QTc 492 ms  Assessment/Plan Present on Admission:  Asthma exacerbation  Principal Problem:   Asthma exacerbation Active Problems:   Essential hypertension   Obesity, Class III, BMI 40-49.9 (morbid obesity) (HCC)   Acute respiratory failure with hypoxia (HCC)   Elevated d-dimer   Lactic acidosis   Hypokalemia   Elevated brain natriuretic peptide (BNP) level   Hyperglycemia due to diabetes mellitus ([place])   CAP (community acquired pneumonia)   Mixed hyperlipidemia  Acute respiratory failure with hypoxia secondary to acute exacerbation  of asthma with presumed superimposed early onset CAP POA Patient does not meet SIRS criteria or qSOFA at this time Patient was started on ceftriaxone and azithromycin, we shall continue with ceftriaxone and change azithromycin to doxycycline due to prolonged QTc with plan to de-escalate/discontinue based on blood culture, sputum culture, urine Legionella, strep pneumo. Procalcitonin < 0.10; this will be repeated prior to discontinuing the antibiotics Continue Tylenol as needed Continue Mucinex, incentive spirometry, flutter valve  Continue Atrovent, Solu-Medrol, Breo Ellipta. Continue Protonix to prevent steroid-induced ulcer Continue supplemental oxygen to maintain O2 sat > 92% with plan to wean patient off oxygen as tolerated  Lactic acidosis possibly due to above Lactic acid 3.7 > 7.7, continue gentle IV hydration ABG will be checked  Hypokalemia K+ is 3.2 K+ will be replenished Please monitor for AM K+  for further replenishmemnt  Elevated BNP rule out CHF Patient has no history of CHF BNP 109.5; Continue total input/output, daily weights and fluid restriction Continue Cardiac diet  Echocardiogram in the morning   Bilateral pleural effusions CT angiography of chest showed trace bilateral pleural effusion with left greater than right groundglass opacity Pulmonary edema was suggested.  Patient was in no acute distress at this time Continue telemetry Consider IV diuresis for worsening of symptoms Echocardiogram will be done in the morning  Elevated D-dimer D-dimer 1.28; CT angiography of chest showed no central/saddle pulmonary embolism  Prolonged QTc (430ms) Continue telemetry Avoid QT prolonging drugs Magnesium level will be checked  Hyperglycemia secondary to T2DM Continue insulin sliding scale and hypoglycemic protocol  Essential hypertension Continue lisinopril  Mixed hyperlipidemia Continue Lipitor  Obesity class III (BMI 42.57 kg/m) Patient will be counseled on diet and lifestyle modification when more stable Patient may need to follow-up with outpatient PCP for weight loss program  DVT prophylaxis: Lovenox  Code Status: Full code  Family Communication: Friend at bedside (all questions answered to satisfaction)  Disposition Plan:  Patient is from:                        home Anticipated DC to:                   SNF or family members home Anticipated DC date:               2-3 days Anticipated DC barriers:          Patient requires inpatient management due to severity of symptoms    Consults called: None  Admission status: Inpatient    Bernadette Hoit MD [facility]  08/02/2021, 8:11 PM

## 2021-08-02 NOTE — Sepsis Progress Note (Signed)
Notified provider of need to order fluid bolus.  ?

## 2021-08-02 NOTE — Sepsis Progress Note (Signed)
eLink is following this Code Sepsis. °

## 2021-08-02 NOTE — ED Notes (Signed)
Patient satting at 97% on 4L via [place].

## 2021-08-02 NOTE — ED Notes (Signed)
Lab to add on urine.

## 2021-08-02 NOTE — Progress Notes (Signed)
CODE SEPSIS - PHARMACY COMMUNICATION  **Broad Spectrum Antibiotics should be administered within 1 hour of Sepsis diagnosis**  Time Code Sepsis Called/Page Received: 1438  Antibiotics Ordered: ceftriaxone/azithromycin  Time of 1st antibiotic administration: [place], PharmD, BCPS Clinical Pharmacist 08/02/2021 3:59 PM

## 2021-08-02 NOTE — ED Triage Notes (Addendum)
Pt to ER via POV with complaints of asthma exacerbation/ shortness of breath. Reports she was exercising when suddenly became short of breath. Reports having sports induced asthma. States she used approx 12 puffs or more of her rescue inhaler with no relief in symptoms. Pt talking in short sentence structure. Dry cough.   Also reports doubling up her lasix dose this week. Reports new leg swelling.

## 2021-08-02 NOTE — ED Provider Notes (Signed)
[facility] Provider Note    Event Date/Time   First MD Initiated Contact with Patient 08/02/21 1145     (approximate)   History   Asthma   HPI  AUTYMN OMLOR is a 46 y.o. female with a past medical history of HTN, depression, known left bundle branch block, DM, morbid obesity, and no known CHF history although is on regular Lasix due to "I accumulate fluid" who presents for assessment of sudden onset of shortness of breath associated wheezing and coughing that occurred while patient started going up some steps as part of the new exercise.  Patient states she was feeling fine immediately before this.  She denies any chest pain, back pain, abdominal pain, fevers, headache, earache, sore throat, vomiting, diarrhea, burning with urination, rash or extremity pain.  No recent falls or injuries.  She uses albuterol as needed at home but does not use steroids.  She does not normally wear any oxygen.  She denies any other cardiac or pulmonary history and states she had an echocardiogram a couple months ago that showed normal cardiac function.          Physical Exam  Triage Vital Signs: ED Triage Vitals  Enc Vitals Group     BP 08/02/21 1116 (!) 133/92     Pulse Rate 08/02/21 1116 (!) 113     Resp 08/02/21 1116 (!) 30     Temp 08/02/21 1116 99.1 F (37.3 C)     Temp Source 08/02/21 1116 Oral     SpO2 08/02/21 1116 (!) 75 %     Weight --      Height 08/02/21 1116 5\' 8"  (1.727 m)     Head Circumference --      Peak Flow --      Pain Score 08/02/21 1116 0     Pain Loc --      Pain Edu? --      Excl. in [place]? --     Most recent vital signs: Vitals:   08/02/21 1345 08/02/21 1413  BP:  (!) 142/68  Pulse: (!) 110 (!) 110  Resp:  18  Temp:  98.5 F (36.9 C)  SpO2: 92% 91%    General: Awake, moderate distress CV:  Good peripheral perfusion.  No murmurs rubs or gallops.  Slightly tachycardic. Resp:  Diminished breath sounds bilaterally with some end  expiratory wheezing.  Patient is tachypneic with slight accessory muscle use. Abd:  No distention.  Nontender throughout Other:  Subtle 1+ bilateral edema.   ED Results / Procedures / Treatments  Labs (all labs ordered are listed, but only abnormal results are displayed) Labs Reviewed  CBC WITH DIFFERENTIAL/PLATELET - Abnormal; Notable for the following components:      Result Value   WBC 11.1 (*)    Neutro Abs 8.4 (*)    Abs Immature Granulocytes 0.08 (*)    All other components within normal limits  COMPREHENSIVE METABOLIC PANEL - Abnormal; Notable for the following components:   Potassium 3.2 (*)    Glucose, Bld 246 (*)    Calcium 8.7 (*)    All other components within normal limits  BRAIN NATRIURETIC PEPTIDE - Abnormal; Notable for the following components:   B Natriuretic Peptide 109.5 (*)    All other components within normal limits  BLOOD GAS, VENOUS - Abnormal; Notable for the following components:   pO2, Ven 51.0 (*)    Acid-Base Excess 2.7 (*)    All other components within  normal limits  LACTIC ACID, PLASMA - Abnormal; Notable for the following components:   Lactic Acid, Venous 3.7 (*)    All other components within normal limits  D-DIMER, QUANTITATIVE - Abnormal; Notable for the following components:   D-Dimer, Quant 1.28 (*)    All other components within normal limits  TROPONIN I (HIGH SENSITIVITY) - Abnormal; Notable for the following components:   Troponin I (High Sensitivity) 19 (*)    All other components within normal limits  TROPONIN I (HIGH SENSITIVITY) - Abnormal; Notable for the following components:   Troponin I (High Sensitivity) 18 (*)    All other components within normal limits  RESP PANEL BY RT-PCR (FLU A&B, COVID) ARPGX2  CULTURE, BLOOD (ROUTINE X 2)  CULTURE, BLOOD (ROUTINE X 2)  PROCALCITONIN  PROTIME-INR  APTT  LACTIC ACID, PLASMA  URINALYSIS, COMPLETE (UACMP) WITH MICROSCOPIC  HCG, QUANTITATIVE, PREGNANCY  POC URINE PREG, ED      EKG  ECG remarkable for sinus tachycardia with ventricular rate of 110, left axis deviation, left bundle branch block with otherwise unremarkable intervals and some nonspecific ST changes in inferior lateral anterior leads without other clear evidence of acute ischemia.  EKG from 02/16/2019 was reviewed at that time patient was also noted to have a left bundle branch block with similar nonspecific changes throughout.   RADIOLOGY  Chest x-ray reviewed by myself remarkable for decreased lung volumes bilaterally.  There is a little bit of increased patchiness on the left lower base compared to the right without other focal consolidation, effusion, edema, pneumothorax or other clear acute thoracic process.    PROCEDURES:  Critical Care performed: Yes, see critical care procedure note(s)  .Critical Care Performed by: Lucrezia Starch, MD Authorized by: Lucrezia Starch, MD   Critical care provider statement:    Critical care time (minutes):  30   Critical care was necessary to treat or prevent imminent or life-threatening deterioration of the following conditions:  Respiratory failure   Critical care was time spent personally by me on the following activities:  Development of treatment plan with patient or surrogate, discussions with consultants, evaluation of patient's response to treatment, examination of patient, ordering and review of laboratory studies, ordering and review of radiographic studies, ordering and performing treatments and interventions, pulse oximetry, re-evaluation of patient's condition and review of old [place] ED: Medications  azithromycin (ZITHROMAX) 500 mg in sodium chloride 0.9 % 250 mL IVPB (500 mg Intravenous New Bag/Given 08/02/21 1515)  ipratropium-albuterol (DUONEB) 0.5-2.5 (3) MG/3ML nebulizer solution 9 mL (has no administration in time range)  lactated ringers infusion (has no administration in time range)  ipratropium-albuterol  (DUONEB) 0.5-2.5 (3) MG/3ML nebulizer solution 3 mL (3 mLs Nebulization Given 08/02/21 1128)  ipratropium-albuterol (DUONEB) 0.5-2.5 (3) MG/3ML nebulizer solution 9 mL (9 mLs Nebulization Given 08/02/21 1208)  methylPREDNISolone sodium succinate (SOLU-MEDROL) 125 mg/2 mL injection 125 mg (125 mg Intravenous Given 08/02/21 1212)  potassium chloride SA (KLOR-CON M) CR tablet 40 mEq (40 mEq Oral Given 08/02/21 1247)  cefTRIAXone (ROCEPHIN) 1 g in sodium chloride 0.9 % 100 mL IVPB (0 g Intravenous Stopped 08/02/21 1426)  lactated ringers bolus 1,000 mL (1,000 mLs Intravenous New Bag/Given 08/02/21 1510)     IMPRESSION / MDM / [place] / ED COURSE  I reviewed the triage vital signs and the nursing notes.  Differential diagnosis includes, but is not limited to asthma exacerbation, bronchitis, pneumonia, pneumothorax, CHF, PE, arrhythmia, anemia and metabolic derangements.  Chest x-ray reviewed by myself remarkable for decreased lung volumes bilaterally.  There is a little bit of increased patchiness on the left lower base compared to the right without other focal consolidation, effusion, edema, pneumothorax or other clear acute thoracic process.   ECG remarkable for sinus tachycardia with ventricular rate of 110, left axis deviation, left bundle branch block with otherwise unremarkable intervals and some nonspecific ST changes in inferior lateral anterior leads without other clear evidence of acute ischemia.  Troponins flat and downtrending at 19 and 18 reflecting some very mild demand with low suspicion for occlusion MI or myocarditis.  EKG from 02/16/2019 was reviewed at that time patient was also noted to have a left bundle branch block with similar nonspecific changes throughout.  CBC remarkable for WBC count of 11.1 without evidence of acute anemia and normal platelets.  CMP remarkable for potassium of 3.2, glucose of 246 without other significant electrolyte or  metabolic derangements.  Procalcitonin is undetectable.  BNP is just above upper limit of normal at 109.5.  COVID influenza PCR is negative.  VBG without evidence of hypercarbic respiratory failure with a pH of 7.1 with a PCO2 of 44 and a bicarb of 27.9.  Initial lactic acid elevated 3.7.  D-dimer elevated 1.28.  INR is unremarkable.  Impression is an asthma exacerbation with concha mitten likely early pneumonia.  In addition to duo nebs and Solu-Medrol patient treated with broad-spectrum antibiotics in emergency room.  Blood cultures obtained.  We will start patient on IV fluids and obtain a CTA to rule out PE as well.  Patient will require admission to medicine service due to persistent hypoxia cells only able to down treat her oxygen to 3 L nasal cannula on reassessment after 2 DuoNeb's.  On trial of ambulation at room air she had a desaturation to 78% which improved back to mid 90s on 3 L.  Will defer large-volume fluid resuscitation as patient appears relatively euvolemic suspect a component of her lactic acidosis is secondary to her back-to-back DuoNeb's.  We will gently hydrate pending repeat.  However given tachycardia and evidence of respiratory failure sepsis has not been completely ruled out and that is why blood cultures were obtained as well.  Care patient signed over to assuming provider at approximately 1500.  Plan is to follow-up CTA and admit.      FINAL CLINICAL IMPRESSION(S) / ED DIAGNOSES   Final diagnoses:  Acute respiratory failure with hypoxia (HCC)  Positive D dimer  Community acquired pneumonia, unspecified laterality  Severe asthma with exacerbation, unspecified whether persistent  SIRS (systemic inflammatory response syndrome) (HCC)  Sepsis, due to unspecified organism, unspecified whether acute organ dysfunction present [facility])     Rx / DC Orders   ED Discharge Orders     None        Note:  This document was prepared using Dragon voice recognition software  and may include unintentional dictation errors.   Lucrezia Starch, MD 08/02/21 [phone]

## 2021-08-02 NOTE — ED Provider Triage Note (Signed)
Emergency Medicine Provider Triage Evaluation Note  Sheila Hicks , a 46 y.o. female  was evaluated in triage.  Pt complains of shortness of breath.  Patient was trying to exercise by walking up and down the steps in her home when she became very short of breath and thinks she is having an asthma exasperation.  Patient also has a lot of fluid buildup in her ankles which is new.  No fever or chills.  Review of Systems  Positive: Chest pain, shortness of breath, Negative: Fever, chills  Physical Exam  BP (!) 133/92 (BP Location: Left Arm)    Pulse (!) 113    Temp 99.1 F (37.3 C) (Oral)    Resp (!) 30    Ht 5\' 8"  (1.727 m)    LMP 04/27/2021    SpO2 (!) 75%    BMI 42.57 kg/m  Gen:   Awake, mild respiratory distress, DuoNeb in process Resp:  Normal effort rhonchi bilaterally MSK:   Moves extremities without difficulty swelling noted in the ankles bilaterally Other:    Medical Decision Making  Medically screening exam initiated at 11:30 AM.  Appropriate orders placed.  Sheila Hicks was informed that the remainder of the evaluation will be completed by another provider, this initial triage assessment does not replace that evaluation, and the importance of remaining in the ED until their evaluation is complete.  Patient will be given short of breath protocols along with chest pain protocols.  Concerns for CHF CAP, asthma exasperation, respiratory distress   Versie Starks, PA-C 08/02/21 1132

## 2021-08-03 LAB — COMPREHENSIVE METABOLIC PANEL
ALT: 21 U/L (ref 0–44)
AST: 21 U/L (ref 15–41)
Albumin: 3.5 g/dL (ref 3.5–5.0)
Alkaline Phosphatase: 35 U/L — ABNORMAL LOW (ref 38–126)
Anion gap: 7 (ref 5–15)
BUN: 19 mg/dL (ref 6–20)
CO2: 25 mmol/L (ref 22–32)
Calcium: 8.7 mg/dL — ABNORMAL LOW (ref 8.9–10.3)
Chloride: 104 mmol/L (ref 98–111)
Creatinine, Ser: 0.83 mg/dL (ref 0.44–1.00)
GFR, Estimated: >60 mL/min (ref >60)
Glucose, Bld: 244 mg/dL — ABNORMAL HIGH (ref 70–99)
Potassium: 4.2 mmol/L (ref 3.5–5.1)
Sodium: 136 mmol/L (ref 135–145)
Total Bilirubin: 1.4 mg/dL — ABNORMAL HIGH (ref 0.3–1.2)
Total Protein: 7.1 g/dL (ref 6.5–8.1)

## 2021-08-03 LAB — HEMOGLOBIN A1C
Hgb A1c MFr Bld: 8.1 % — ABNORMAL HIGH (ref 4.8–5.6)
Mean Plasma Glucose: 185.77 mg/dL

## 2021-08-03 LAB — CBC
HCT: 40.8 % (ref 36.0–46.0)
Hemoglobin: 13.2 g/dL (ref 12.0–15.0)
MCH: 28.7 pg (ref 26.0–34.0)
MCHC: 32.4 g/dL (ref 30.0–36.0)
MCV: 88.7 fL (ref 80.0–100.0)
Platelets: 254 10*3/uL (ref 150–400)
RBC: 4.6 MIL/uL (ref 3.87–5.11)
RDW: 13.3 % (ref 11.5–15.5)
WBC: 13.4 10*3/uL — ABNORMAL HIGH (ref 4.0–10.5)
nRBC: 0 % (ref 0.0–0.2)

## 2021-08-03 LAB — PROCALCITONIN: Procalcitonin: <0.1 ng/mL

## 2021-08-03 LAB — EXPECTORATED SPUTUM ASSESSMENT W GRAM STAIN, RFLX TO RESP C

## 2021-08-03 LAB — GLUCOSE, CAPILLARY
Glucose-Capillary: 266 mg/dL — ABNORMAL HIGH (ref 70–99)
Glucose-Capillary: 280 mg/dL — ABNORMAL HIGH (ref 70–99)
Glucose-Capillary: 315 mg/dL — ABNORMAL HIGH (ref 70–99)

## 2021-08-03 LAB — LACTIC ACID, PLASMA
Lactic Acid, Venous: 2.9 mmol/L (ref 0.5–1.9)
Lactic Acid, Venous: 2 mmol/L (ref 0.5–1.9)

## 2021-08-03 LAB — PHOSPHORUS: Phosphorus: 3.1 mg/dL (ref 2.5–4.6)

## 2021-08-03 LAB — CBG MONITORING, ED: Glucose-Capillary: 228 mg/dL — ABNORMAL HIGH (ref 70–99)

## 2021-08-03 MED ORDER — ATORVASTATIN CALCIUM 20 MG PO TABS
20.0000 mg | ORAL_TABLET | Freq: Every day | ORAL | Status: DC
  Administered 2021-08-03 – 2021-08-04 (×2): 20 mg via ORAL
  Filled 2021-08-03 (×2): qty 1

## 2021-08-03 MED ORDER — LISINOPRIL 20 MG PO TABS
20.0000 mg | ORAL_TABLET | Freq: Every day | ORAL | Status: DC
Start: 2021-08-03 — End: 2021-08-04
  Administered 2021-08-03 – 2021-08-04 (×2): 20 mg via ORAL
  Filled 2021-08-03 (×2): qty 1

## 2021-08-03 MED ORDER — FLUTICASONE FUROATE-VILANTEROL 200-25 MCG/ACT IN AEPB
1.0000 | INHALATION_SPRAY | Freq: Every day | RESPIRATORY_TRACT | Status: DC
  Administered 2021-08-03 – 2021-08-04 (×2): 1 via RESPIRATORY_TRACT
  Filled 2021-08-03 (×2): qty 28

## 2021-08-03 NOTE — Progress Notes (Signed)
PROGRESS NOTE  Sheila Hicks OHY:[id number] DOB: 1976-03-08 DOA: 08/02/2021 PCP: Gae Bon, NP  HPI/Recap of past 24 hours: Sheila Hicks is a 46 y.o. female with medical history significant for asthma (sports induced), T2DM, LBBB, morbid obesity, hyperlipidemia, hypertension who presents to the emergency department accompanied by a friend due to sudden onset of shortness of breath which started shortly after she started to exercise, chief complaint of wheezing, coughing with production of clear sputum.  She used home inhaler albuterol and took about 12 puffs within 1 hour without improvement, she then decided to call her friend who brought her to the ED for further evaluation and management.  Work-up revealed acute hypoxic respiratory failure secondary to acute asthma exacerbation, superimposed by community-acquired pneumonia.  She was started on IV antibiotics, IV steroids and bronchodilators.   08/03/21: Patient was seen and examined at her bedside.  States her breathing is improved this morning compared to when she first came to the hospital  Assessment/Plan: Principal Problem:   Asthma exacerbation Active Problems:   Essential hypertension   Obesity, Class III, BMI 40-49.9 (morbid obesity) (HCC)   Acute respiratory failure with hypoxia (HCC)   Elevated d-dimer   Lactic acidosis   Hypokalemia   Elevated brain natriuretic peptide (BNP) level   Hyperglycemia due to diabetes mellitus ([place])   CAP (community acquired pneumonia)   Mixed hyperlipidemia   Prolonged QT interval  Acute hypoxic respiratory failure secondary to acute asthma exacerbation superimposed by community-acquired pneumonia, POA. Normal oxygen supplementation at baseline Continue to treat underlying conditions Continue to wean off oxygen supplementation as tolerated On 2 L at the time of this visit Home oxygen evaluation on 08/03/2021. Continue IV antibiotics, bronchodilators, pulmonary toilet Out of  bed to chair as tolerated Mobilize as tolerated Incentive spirometer, flutter valve Maintain O2 saturation greater 92%  Sepsis secondary to community-acquired pneumonia, POA Management as stated above Personally reviewed chest x-ray done on admission which shows bibasilar pulmonary infiltrates left greater than right. She was started on Rocephin and azithromycin, azithromycin was changed to doxycycline due to prolonged QTC. Monitor fever curve and WBC.  Lactic acidosis in the setting of sepsis On presentation lactic acid 7.7, down trended to 2.0.  Continue IV antibiotics empirically Repeat lactic acid level in the morning Monitor fever curve and WBC  Type 2 diabetes with hyperglycemia Hemoglobin A1c 8.1 on 08/03/2021. Continue insulin sliding scale  Hypomagnesemia Magnesium 1.3 Repleted intravenously  QTC prolongation Avoid QTC prolonging agents Optimize magnesium and potassium levels Repeat twelve-lead EKG in the morning Continue to monitor on telemetry  Morbid obesity BMI 42 Recommend weight loss outpatient regular physical activity and healthy dieting.    Code Status: Full code  Family Communication: None at bedside  Disposition Plan: Likely will discharge to home on 08/04/2021   Consultants: None  Procedures: None  Antimicrobials: Rocephin, doxycycline  DVT prophylaxis: Subcu Lovenox daily  Status is: Inpatient  Inpatient status, patient requires at least 2 midnights for further evaluation and treatment of present condition.      Objective: Vitals:   08/03/21 0700 08/03/21 0800 08/03/21 1031 08/03/21 1208  BP: 117/66 (!) 101/59 118/72 (!) 145/73  Pulse: 92 88 86 85  Resp: (!) 24 13 18 17   Temp:  98.7 F (37.1 C) 97.6 F (36.4 C) 97.7 F (36.5 C)  TempSrc:  Oral Oral Oral  SpO2: 96% 100% 94% 97%  Weight:      Height:        Intake/Output  Summary (Last 24 hours) at 08/03/2021 1429 Last data filed at 08/03/2021 0244 Gross per 24 hour  Intake  129.49 ml  Output --  Net 129.49 ml   Filed Weights   08/02/21 1249  Weight: 127 kg    Exam:  General: 46 y.o. year-old female well developed well nourished in no acute distress.  Alert and oriented x3. Cardiovascular: Regular rate and rhythm with no rubs or gallops.  No thyromegaly or JVD noted.   Respiratory: Mild diffuse wheezing bilaterally.  Mild rales at bases.  Good inspiratory effort. Abdomen: Soft nontender nondistended with normal bowel sounds x4 quadrants. Musculoskeletal: No lower extremity edema. 2/4 pulses in all 4 extremities. Skin: No ulcerative lesions noted or rashes, Psychiatry: Mood is appropriate for condition and setting   Data Reviewed: CBC: Recent Labs  Lab 08/02/21 1126 08/03/21 0500  WBC 11.1* 13.4*  NEUTROABS 8.4*  --   HGB 14.8 13.2  HCT 44.3 40.8  MCV 88.2 88.7  PLT 239 941   Basic Metabolic Panel: Recent Labs  Lab 08/02/21 1126 08/02/21 1530 08/03/21 0500  NA 135  --  136  K 3.2*  --  4.2  CL 100  --  104  CO2 25  --  25  GLUCOSE 246*  --  244*  BUN 13  --  19  CREATININE 0.79  --  0.83  CALCIUM 8.7*  --  8.7*  MG  --  1.3*  --   PHOS  --   --  3.1   GFR: Estimated Creatinine Clearance: 121.7 mL/min (by C-G formula based on SCr of 0.83 mg/dL). Liver Function Tests: Recent Labs  Lab 08/02/21 1126 08/03/21 0500  AST 27 21  ALT 21 21  ALKPHOS 42 35*  BILITOT 1.1 1.4*  PROT 7.6 7.1  ALBUMIN 3.6 3.5   No results for input(s): LIPASE, AMYLASE in the last 168 hours. No results for input(s): AMMONIA in the last 168 hours. Coagulation Profile: Recent Labs  Lab 08/02/21 1310  INR 1.0   Cardiac Enzymes: No results for input(s): CKTOTAL, CKMB, CKMBINDEX, TROPONINI in the last 168 hours. BNP (last 3 results) No results for input(s): PROBNP in the last 8760 hours. HbA1C: Recent Labs    08/03/21 0032  HGBA1C 8.1*   CBG: Recent Labs  Lab 08/02/21 2134 08/03/21 0731 08/03/21 1210  GLUCAP 315* 228* 266*   Lipid  Profile: No results for input(s): CHOL, HDL, LDLCALC, TRIG, CHOLHDL, LDLDIRECT in the last 72 hours. Thyroid Function Tests: No results for input(s): TSH, T4TOTAL, FREET4, T3FREE, THYROIDAB in the last 72 hours. Anemia Panel: No results for input(s): VITAMINB12, FOLATE, FERRITIN, TIBC, IRON, RETICCTPCT in the last 72 hours. Urine analysis:    Component Value Date/Time   COLORURINE STRAW (A) 08/02/2021 1310   APPEARANCEUR CLEAR (A) 08/02/2021 1310   APPEARANCEUR Cloudy (A) 10/28/2018 1131   LABSPEC 1.029 08/02/2021 1310   PHURINE 5.0 08/02/2021 1310   GLUCOSEU >=500 (A) 08/02/2021 1310   HGBUR NEGATIVE 08/02/2021 1310   BILIRUBINUR NEGATIVE 08/02/2021 1310   BILIRUBINUR Negative 10/28/2018 [place] 08/02/2021 1310   PROTEINUR NEGATIVE 08/02/2021 1310   NITRITE NEGATIVE 08/02/2021 1310   LEUKOCYTESUR TRACE (A) 08/02/2021 1310   Sepsis Labs: @LABRCNTIP (procalcitonin:4,lacticidven:4)  ) Recent Results (from the past 240 hour(s))  Resp Panel by RT-PCR (Flu A&B, Covid) Nasopharyngeal Swab     Status: None   Collection Time: 08/02/21 11:59 AM   Specimen: Nasopharyngeal Swab; Nasopharyngeal(NP) swabs in vial transport medium  Result Value Ref Range Status   SARS Coronavirus 2 by RT PCR NEGATIVE NEGATIVE Final    Comment: (NOTE) SARS-CoV-2 target nucleic acids are NOT DETECTED.  The SARS-CoV-2 RNA is generally detectable in upper respiratory specimens during the acute phase of infection. The lowest concentration of SARS-CoV-2 viral copies this assay can detect is 138 copies/mL. A negative result does not preclude SARS-Cov-2 infection and should not be used as the sole basis for treatment or other patient management decisions. A negative result may occur with  improper specimen collection/handling, submission of specimen other than nasopharyngeal swab, presence of viral mutation(s) within the areas targeted by this assay, and inadequate number of viral copies(<138  copies/mL). A negative result must be combined with clinical observations, patient history, and epidemiological information. The expected result is Negative.  Fact Sheet for Patients:  EntrepreneurPulse.com.au  Fact Sheet for Healthcare Providers:  IncredibleEmployment.be  This test is no t yet approved or cleared by the [place] FDA and  has been authorized for detection and/or diagnosis of SARS-CoV-2 by FDA under an Emergency Use Authorization (EUA). This EUA will remain  in effect (meaning this test can be used) for the duration of the COVID-19 declaration under Section 564(b)(1) of the Act, 21 U.S.C.section 360bbb-3(b)(1), unless the authorization is terminated  or revoked sooner.       Influenza A by PCR NEGATIVE NEGATIVE Final   Influenza B by PCR NEGATIVE NEGATIVE Final    Comment: (NOTE) The Xpert Xpress SARS-CoV-2/FLU/RSV plus assay is intended as an aid in the diagnosis of influenza from Nasopharyngeal swab specimens and should not be used as a sole basis for treatment. Nasal washings and aspirates are unacceptable for Xpert Xpress SARS-CoV-2/FLU/RSV testing.  Fact Sheet for Patients: EntrepreneurPulse.com.au  Fact Sheet for Healthcare Providers: IncredibleEmployment.be  This test is not yet approved or cleared by the [place] FDA and has been authorized for detection and/or diagnosis of SARS-CoV-2 by FDA under an Emergency Use Authorization (EUA). This EUA will remain in effect (meaning this test can be used) for the duration of the COVID-19 declaration under Section 564(b)(1) of the Act, 21 U.S.C. section 360bbb-3(b)(1), unless the authorization is terminated or revoked.  Performed at [facility], [address] [place], [place] [address]   Blood Culture (routine x 2)     Status: None (Preliminary result)   Collection Time: 08/02/21  1:10 PM   Specimen: BLOOD   Result Value Ref Range Status   Specimen Description BLOOD LEFT AC  Final   Special Requests   Final    BOTTLES DRAWN AEROBIC AND ANAEROBIC Blood Culture adequate volume   Culture   Final    NO GROWTH < 24 HOURS Performed at [facility], [address], [place], [place] [address]    Report Status PENDING  Incomplete  Blood Culture (routine x 2)     Status: None (Preliminary result)   Collection Time: 08/02/21  1:15 PM   Specimen: BLOOD  Result Value Ref Range Status   Specimen Description BLOOD BLOOD RIGHT HAND  Final   Special Requests   Final    BOTTLES DRAWN AEROBIC AND ANAEROBIC Blood Culture adequate volume   Culture   Final    NO GROWTH < 24 HOURS Performed at [facility], [address] [place], [place] [address]    Report Status PENDING  Incomplete  Expectorated Sputum Assessment w Gram Stain, Rflx to Resp Cult     Status: None   Collection Time:  08/03/21  5:00 AM   Specimen: Sputum  Result Value Ref Range Status   Specimen Description SPUTUM  Final   Special Requests NONE  Final   Sputum evaluation   Final    Sputum specimen not acceptable for testing.  Please recollect.   C/KARA BRIDGES AT 1610 08/03/21.PMF Performed at [facility], [address] [place], [place] [address]    Report Status 08/03/2021 FINAL  Final      Studies: CT Angio Chest PE W and/or Wo Contrast  Result Date: 08/02/2021 CLINICAL DATA:  Pulmonary embolism suspected. High probability. Past medical history includes diabetes. Hypertension. EXAM: CT ANGIOGRAPHY CHEST WITH CONTRAST TECHNIQUE: Multidetector CT imaging of the chest was performed using the standard protocol during bolus administration of intravenous contrast. Multiplanar CT image reconstructions and MIPs were obtained to evaluate the vascular anatomy. CONTRAST:  54mL OMNIPAQUE IOHEXOL 350 MG/ML SOLN COMPARISON:  Chest radiograph of earlier today.  CT 02/12/2019 FINDINGS: Cardiovascular: The quality of this  exam for evaluation of pulmonary embolism is poor. Limitations include motion, patient body habitus, and suboptimal bolus timing. Contrast centered in the SVC. No central/saddle pulmonary embolism. No large lobar embolism to the lower lungs. Other emboli cannot be excluded. Aortic atherosclerosis. Moderate cardiomegaly, without pericardial effusion. Mediastinum/Nodes: No mediastinal or hilar adenopathy. Lungs/Pleura: Trace bilateral pleural effusions. Left greater than right, relatively diffuse ground-glass opacity. Upper Abdomen: Hepatic steatosis. Normal imaged portions of the spleen, stomach, pancreas, gallbladder, adrenal glands, right kidney. There may be mild left renal cortical thinning. Motion degradation continues into the upper abdomen. Musculoskeletal: No acute osseous abnormality. Lower thoracic spondylosis. Review of the MIP images confirms the above findings. IMPRESSION: 1. Multifactorial degradation, including motion, patient body habitus, and suboptimal bolus timing. 2. No central/saddle pulmonary embolism. Other emboli cannot be excluded. 3. Trace bilateral pleural effusions with left greater than right ground-glass opacity. Pulmonary edema is slightly favored. Atypical infection could look similar. 4. Hepatic steatosis. 5. Aortic Atherosclerosis (ICD10-I70.0). Electronically Signed   By: Abigail Miyamoto M.D.   On: 08/02/2021 16:20    Scheduled Meds:  atorvastatin  20 mg Oral Daily   dextromethorphan-guaiFENesin  1 tablet Oral BID   doxycycline  100 mg Oral Q12H   enoxaparin (LOVENOX) injection  0.5 mg/kg Subcutaneous Q24H   fluticasone furoate-vilanterol  1 puff Inhalation Daily   insulin aspart  0-20 Units Subcutaneous TID WC   insulin aspart  0-5 Units Subcutaneous QHS   ipratropium  0.5 mg Nebulization Q6H   lisinopril  20 mg Oral Daily   methylPREDNISolone (SOLU-MEDROL) injection  80 mg Intravenous Daily   pantoprazole  40 mg Oral Daily    Continuous Infusions:  cefTRIAXone  (ROCEPHIN)  IV 2 g (08/03/21 1050)     LOS: 1 day     Kayleen Memos, MD [facility] Pager [phone]  If 7PM-7AM, please contact night-coverage [email or web address] Password [facility] 08/03/2021, 2:29 PM

## 2021-08-03 NOTE — TOC CM/SW Note (Signed)
°  Transition of Care [facility]) Screening Note   Patient Details  Name: AASHKA SALOMONE Date of Birth: 10-May-1976   Transition of Care [facility]) CM/SW Contact:    Candie Chroman, LCSW Phone Number: 08/03/2021, 4:02 PM    Transition of Care Department [facility]) has reviewed patient and no TOC needs have been identified at this time. We will continue to monitor patient advancement through interdisciplinary progression rounds. If new patient transition needs arise, please place a TOC consult.

## 2021-08-03 NOTE — Progress Notes (Signed)
Inpatient Diabetes Program Recommendations  AACE/ADA: New Consensus Statement on Inpatient Glycemic Control   Target Ranges:  Prepandial:   less than 140 mg/dL      Peak postprandial:   less than 180 mg/dL (1-2 hours)      Critically ill patients:  140 - 180 mg/dL    Latest Reference Range & Units 08/02/21 21:34 08/03/21 07:31  Glucose-Capillary 70 - 99 mg/dL 315 (H) 228 (H)    Latest Reference Range & Units 08/03/21 00:32  Hemoglobin A1C 4.8 - 5.6 % 8.1 (H)   Review of Glycemic Control  Diabetes history: DM2 Outpatient Diabetes medications: Mounjaro 5 mg Qweek Current orders for Inpatient glycemic control: Novolog 0-20 units TID with meals, Novolog 0-5 units QHS; Solumedrol 80 mg daily  Inpatient Diabetes Program Recommendations:    Insulin: If steroids are continued, please consider ordering Semglee 12 units Q24H.  HbgA1C: A1C 8.1% on 08/03/21 indicating an average glucose of 186 mg/dl over the past 2-3 months.  NOTE: Spoke with patient over the phone regarding DM control. Patient states that she has been taking Mounjaro (started 6 weeks ago) and she took for 4 weeks and her refill could not be filled by her pharmacy because they were out of the medication for past 2 weeks. She states that she was told that her pharmacy filled the [facility] yesterday so it is now ready for her to pick up from her pharmacy. Patient states that her glucose has been much better since starting the Mounjaro (reports CBG max of 159 mg/dl last week). Patient reports her last A1C was 7.6%. Discussed current A1C of 8.1% indicating an average glucose of 186 mg/dl. Explained that if she has been out of [facility] for 2 weeks then that likely contributed to higher A1C. Informed patient that in the future if medication was not available at her usual pharmacy, she could call around and find a pharmacy that has the medication and ask that he prescription be sent over to the pharmacy that has the medication. Patient was not  aware that she could request Rx be transferred to a different pharmacy. Discussed impact of steroids (currently ordered) on glucose trends and informed patient that insulin would be used while inpatient for DM control. Patient reports that she has an appointment with her PCP on Monday or Tuesday of next week.  Patient verbalized understanding of information and states she has no questions at this time.  Thanks, Barnie Alderman, RN, MSN, CDE Diabetes Coordinator Inpatient Diabetes Program [phone] (Team Pager from 8am to 5pm)

## 2021-08-03 NOTE — ED Notes (Signed)
Kara RN aware of assigned bed 

## 2021-08-04 LAB — GLUCOSE, CAPILLARY
Glucose-Capillary: 246 mg/dL — ABNORMAL HIGH (ref 70–99)
Glucose-Capillary: 274 mg/dL — ABNORMAL HIGH (ref 70–99)

## 2021-08-04 LAB — MAGNESIUM: Magnesium: 2.2 mg/dL (ref 1.7–2.4)

## 2021-08-04 MED ORDER — BENZONATATE 200 MG PO CAPS: 200.0000 mg | ORAL_CAPSULE | Freq: Three times a day (TID) | ORAL | 0 refills | Status: DC

## 2021-08-04 MED ORDER — PREDNISONE 20 MG PO TABS: 40.0000 mg | ORAL_TABLET | Freq: Every day | ORAL | 0 refills | Status: AC

## 2021-08-04 MED ORDER — DOXYCYCLINE HYCLATE 100 MG PO TABS: 100.0000 mg | ORAL_TABLET | Freq: Two times a day (BID) | ORAL | 0 refills | Status: AC

## 2021-08-04 MED ORDER — PREDNISONE 20 MG PO TABS
40.0000 mg | ORAL_TABLET | Freq: Every day | ORAL | Status: DC
Start: 2021-08-05 — End: 2021-08-04

## 2021-08-04 MED ORDER — BENZONATATE 100 MG PO CAPS
200.0000 mg | ORAL_CAPSULE | Freq: Three times a day (TID) | ORAL | Status: DC
  Administered 2021-08-04: 200 mg via ORAL
  Filled 2021-08-04: qty 2

## 2021-08-04 MED ORDER — PANTOPRAZOLE SODIUM 40 MG PO TBEC: 40.0000 mg | DELAYED_RELEASE_TABLET | Freq: Every day | ORAL | 0 refills | Status: DC

## 2021-08-04 MED ORDER — CEFDINIR 300 MG PO CAPS: 300.0000 mg | ORAL_CAPSULE | Freq: Two times a day (BID) | ORAL | 0 refills | Status: AC

## 2021-08-04 MED ORDER — ALBUTEROL SULFATE (2.5 MG/3ML) 0.083% IN NEBU: 2.5000 mg | INHALATION_SOLUTION | RESPIRATORY_TRACT | 0 refills | Status: DC | PRN

## 2021-08-04 MED ORDER — IPRATROPIUM BROMIDE 0.02 % IN SOLN: 0.5000 mg | Freq: Four times a day (QID) | RESPIRATORY_TRACT | Status: DC | PRN

## 2021-08-04 MED ORDER — CEFDINIR 300 MG PO CAPS: 300.0000 mg | ORAL_CAPSULE | Freq: Two times a day (BID) | ORAL | Status: DC

## 2021-08-04 MED ORDER — ENOXAPARIN SODIUM 80 MG/0.8ML IJ SOSY
0.5000 mg/kg | PREFILLED_SYRINGE | INTRAMUSCULAR | Status: DC
  Filled 2021-08-04: qty 0.7

## 2021-08-04 NOTE — Discharge Summary (Signed)
Discharge Summary  Sheila Hicks HTD:[id number] DOB: [birth year]  PCP: Gae Bon, NP  Admit date: 08/02/2021 Discharge date: 08/04/2021  Time spent: 35 minutes  Recommendations for Outpatient Follow-up:  Follow-up with your primary care provider. Take your medications as prescribed.  Discharge Diagnoses:  Active Hospital Problems   Diagnosis Date Noted   Asthma exacerbation 08/02/2021   Acute respiratory failure with hypoxia (HCC) 08/02/2021   Elevated d-dimer 08/02/2021   Lactic acidosis 08/02/2021   Hypokalemia 08/02/2021   Elevated brain natriuretic peptide (BNP) level 08/02/2021   Hyperglycemia due to diabetes mellitus ([place]) 08/02/2021   CAP (community acquired pneumonia) 08/02/2021   Mixed hyperlipidemia 08/02/2021   Prolonged QT interval 08/02/2021   Obesity, Class III, BMI 40-49.9 (morbid obesity) ([place]) 04/07/2019   Essential hypertension 02/21/2014    Resolved Hospital Problems  No resolved problems to display.    Discharge Condition: Stable  Diet recommendation: Resume previous diet.  Vitals:   08/04/21 0750 08/04/21 1153  BP: 133/67 131/72  Pulse: 69 80  Resp: 18 20  Temp: 98.3 F (36.8 C) 98 F (36.7 C)  SpO2: 97% 96%    History of present illness:   Sheila Hicks is a 46 y.o. female with medical history significant for asthma (sports induced), T2DM, LBBB, morbid obesity, hyperlipidemia, hypertension who presents to the emergency department accompanied by a friend due to sudden onset of shortness of breath which started shortly after she started to exercise, chief complaint of wheezing, coughing with production of clear sputum.  She used home inhaler albuterol and took about 12 puffs within 1 hour without improvement, she then decided to call her friend who brought her to the ED for further evaluation and management.    Work-up revealed acute hypoxic respiratory failure secondary to acute asthma exacerbation, superimposed by  community-acquired pneumonia.  She received IV antibiotics empirically, IV steroids, and bronchodilators.   08/04/21: Patient was seen and examined at bedside.  There were no acute events overnight.  She has no new complaints.  She is eager to go home.  Hospital Course:  Principal Problem:   Asthma exacerbation Active Problems:   Essential hypertension   Obesity, Class III, BMI 40-49.9 (morbid obesity) (HCC)   Acute respiratory failure with hypoxia (HCC)   Elevated d-dimer   Lactic acidosis   Hypokalemia   Elevated brain natriuretic peptide (BNP) level   Hyperglycemia due to diabetes mellitus (HCC)   CAP (community acquired pneumonia)   Mixed hyperlipidemia   Prolonged QT interval  Resolved acute hypoxic respiratory failure secondary to acute asthma exacerbation superimposed by community-acquired pneumonia, POA. Not on oxygen supplementation at baseline Received IV antibiotics, bronchodilators, pulmonary toilet She passed her home oxygen evaluation, O2 saturation 96% on room air with ambulation on 08/04/2021. Obtain pulmonary referral from your PCP.   Resolved sepsis secondary to community-acquired pneumonia, POA She was started on Rocephin and azithromycin, azithromycin was changed to doxycycline due to prolonged QTC. Follow-up with your PCP.   Resolving lactic acidosis in the setting of sepsis On presentation lactic acid 7.7, down trended to 2.0.  Nonseptic appearing on the day of discharge Procalcitonin less than 0.10 on the day of discharge.  Type 2 diabetes with hyperglycemia Hemoglobin A1c 8.1 on 08/03/2021. Resume home regimen. Follow-up with your PCP.   Resolved post repletion: Hypomagnesemia Magnesium 1.3>> 2.2   QTC prolongation Continue to avoid QTC prolonging agents Optimize magnesium and potassium levels Potassium 4.2, magnesium 2.2, at goal.   Morbid obesity BMI 42 Recommend  weight loss outpatient regular physical activity and healthy dieting.       Code  Status: Full code      Consultants: None   Procedures: None   Antimicrobials: Rocephin, stopped on 08/04/2021,  doxycycline, continue at time of discharge x3 additional days. Cefdinir started at time of discharge x3 days.     Discharge Exam: BP 131/72 (BP Location: Right Arm)    Pulse 80    Temp 98 F (36.7 C) (Oral)    Resp 20    Ht 5\' 8"  (1.727 m)    Wt (!) 142.4 kg    LMP 04/27/2021    SpO2 96%    BMI 47.73 kg/m  General: 46 y.o. year-old female well developed well nourished in no acute distress.  Alert and oriented x3. Cardiovascular: Regular rate and rhythm with no rubs or gallops.  No thyromegaly or JVD noted.   Respiratory: Clear to auscultation with no wheezes or rales. Good inspiratory effort. Abdomen: Soft nontender nondistended with normal bowel sounds x4 quadrants. Musculoskeletal: No lower extremity edema. 2/4 pulses in all 4 extremities. Skin: No ulcerative lesions noted or rashes, Psychiatry: Mood is appropriate for condition and setting  Discharge Instructions You were cared for by a hospitalist during your hospital stay. If you have any questions about your discharge medications or the care you received while you were in the hospital after you are discharged, you can call the unit and asked to speak with the hospitalist on call if the hospitalist that took care of you is not available. Once you are discharged, your primary care physician will handle any further medical issues. Please note that NO REFILLS for any discharge medications will be authorized once you are discharged, as it is imperative that you return to your primary care physician (or establish a relationship with a primary care physician if you do not have one) for your aftercare needs so that they can reassess your need for medications and monitor your lab values.   Allergies as of 08/04/2021   No Known Allergies      Medication List     TAKE these medications    albuterol 108 (90 Base) MCG/ACT  inhaler Commonly known as: VENTOLIN HFA Inhale 1-2 puffs into the lungs every 6 (six) hours as needed for wheezing or shortness of breath. What changed: Another medication with the same name was added. Make sure you understand how and when to take each.   albuterol (2.5 MG/3ML) 0.083% nebulizer solution Commonly known as: PROVENTIL Take 3 mLs (2.5 mg total) by nebulization every 4 (four) hours as needed for wheezing or shortness of breath. What changed: You were already taking a medication with the same name, and this prescription was added. Make sure you understand how and when to take each.   atorvastatin 20 MG tablet Commonly known as: LIPITOR Take 20 mg by mouth daily.   benzonatate 200 MG capsule Commonly known as: TESSALON Take 1 capsule (200 mg total) by mouth 3 (three) times daily.   Breo Ellipta 200-25 MCG/ACT Aepb Generic drug: fluticasone furoate-vilanterol Inhale 1 puff into the lungs daily.   cefdinir 300 MG capsule Commonly known as: OMNICEF Take 1 capsule (300 mg total) by mouth every 12 (twelve) hours for 3 days. Start taking on: August 05, 2021   doxycycline 100 MG tablet Commonly known as: VIBRA-TABS Take 1 tablet (100 mg total) by mouth every 12 (twelve) hours for 3 days.   Farxiga 10 MG Tabs tablet Generic drug: dapagliflozin  propanediol Take by mouth daily.   furosemide 20 MG tablet Commonly known as: Lasix Take 1 tablet (20 mg total) by mouth 2 (two) times daily.   lisinopril 20 MG tablet Commonly known as: ZESTRIL Take 20 mg by mouth daily.   meloxicam 15 MG tablet Commonly known as: MOBIC Take 1 tablet (15 mg total) by mouth daily.   Mounjaro 5 MG/0.5ML Pen Generic drug: tirzepatide Inject 5 mg into the skin once a week. Patient states she started medcaiton 6 weeks ago. Used for 4 weeks and not taken in last 2 weeks b/c her pharmacy was out of stock.She states they called and let her know they filled the medication yesterday.   multivitamin  with minerals Tabs tablet Take 1 tablet by mouth daily.   pantoprazole 40 MG tablet Commonly known as: PROTONIX Take 1 tablet (40 mg total) by mouth daily. Start taking on: August 05, 2021   predniSONE 20 MG tablet Commonly known as: DELTASONE Take 2 tablets (40 mg total) by mouth daily with breakfast for 3 days. Start taking on: August 05, 2021   vitamin C 1000 MG tablet Take 1,000 mg by mouth daily.               Durable Medical Equipment  (From admission, onward)           Start     Ordered   08/04/21 1055  For home use only DME Nebulizer machine  Once       Question Answer Comment  Patient needs a nebulizer to treat with the following condition Asthma exacerbation   Length of Need 6 Months      08/04/21 1054           No Known Allergies    The results of significant diagnostics from this hospitalization (including imaging, microbiology, ancillary and laboratory) are listed below for reference.    Significant Diagnostic Studies: DG Chest 2 View  Result Date: 08/02/2021 CLINICAL DATA:  Short of breath EXAM: CHEST - 2 VIEW COMPARISON:  02/12/2019 FINDINGS: Hypoventilation with decreased lung volume. Heart size upper normal. Vascularity normal. Patchy bibasilar airspace disease left greater than right. No significant effusion. No focal skeletal abnormality. IMPRESSION: Bibasilar airspace disease left greater than right. Possible pneumonia or atelectasis. Electronically Signed   By: Franchot Gallo M.D.   On: 08/02/2021 11:53   CT Angio Chest PE W and/or Wo Contrast  Result Date: 08/02/2021 CLINICAL DATA:  Pulmonary embolism suspected. High probability. Past medical history includes diabetes. Hypertension. EXAM: CT ANGIOGRAPHY CHEST WITH CONTRAST TECHNIQUE: Multidetector CT imaging of the chest was performed using the standard protocol during bolus administration of intravenous contrast. Multiplanar CT image reconstructions and MIPs were obtained to evaluate the  vascular anatomy. CONTRAST:  52mL OMNIPAQUE IOHEXOL 350 MG/ML SOLN COMPARISON:  Chest radiograph of earlier today.  CT 02/12/2019 FINDINGS: Cardiovascular: The quality of this exam for evaluation of pulmonary embolism is poor. Limitations include motion, patient body habitus, and suboptimal bolus timing. Contrast centered in the SVC. No central/saddle pulmonary embolism. No large lobar embolism to the lower lungs. Other emboli cannot be excluded. Aortic atherosclerosis. Moderate cardiomegaly, without pericardial effusion. Mediastinum/Nodes: No mediastinal or hilar adenopathy. Lungs/Pleura: Trace bilateral pleural effusions. Left greater than right, relatively diffuse ground-glass opacity. Upper Abdomen: Hepatic steatosis. Normal imaged portions of the spleen, stomach, pancreas, gallbladder, adrenal glands, right kidney. There may be mild left renal cortical thinning. Motion degradation continues into the upper abdomen. Musculoskeletal: No acute osseous abnormality. Lower thoracic spondylosis.  Review of the MIP images confirms the above findings. IMPRESSION: 1. Multifactorial degradation, including motion, patient body habitus, and suboptimal bolus timing. 2. No central/saddle pulmonary embolism. Other emboli cannot be excluded. 3. Trace bilateral pleural effusions with left greater than right ground-glass opacity. Pulmonary edema is slightly favored. Atypical infection could look similar. 4. Hepatic steatosis. 5. Aortic Atherosclerosis (ICD10-I70.0). Electronically Signed   By: Abigail Miyamoto M.D.   On: 08/02/2021 16:20    Microbiology: Recent Results (from the past 240 hour(s))  Resp Panel by RT-PCR (Flu A&B, Covid) Nasopharyngeal Swab     Status: None   Collection Time: 08/02/21 11:59 AM   Specimen: Nasopharyngeal Swab; Nasopharyngeal(NP) swabs in vial transport medium  Result Value Ref Range Status   SARS Coronavirus 2 by RT PCR NEGATIVE NEGATIVE Final    Comment: (NOTE) SARS-CoV-2 target nucleic acids  are NOT DETECTED.  The SARS-CoV-2 RNA is generally detectable in upper respiratory specimens during the acute phase of infection. The lowest concentration of SARS-CoV-2 viral copies this assay can detect is 138 copies/mL. A negative result does not preclude SARS-Cov-2 infection and should not be used as the sole basis for treatment or other patient management decisions. A negative result may occur with  improper specimen collection/handling, submission of specimen other than nasopharyngeal swab, presence of viral mutation(s) within the areas targeted by this assay, and inadequate number of viral copies(<138 copies/mL). A negative result must be combined with clinical observations, patient history, and epidemiological information. The expected result is Negative.  Fact Sheet for Patients:  EntrepreneurPulse.com.au  Fact Sheet for Healthcare Providers:  IncredibleEmployment.be  This test is no t yet approved or cleared by the [place] FDA and  has been authorized for detection and/or diagnosis of SARS-CoV-2 by FDA under an Emergency Use Authorization (EUA). This EUA will remain  in effect (meaning this test can be used) for the duration of the COVID-19 declaration under Section 564(b)(1) of the Act, 21 U.S.C.section 360bbb-3(b)(1), unless the authorization is terminated  or revoked sooner.       Influenza A by PCR NEGATIVE NEGATIVE Final   Influenza B by PCR NEGATIVE NEGATIVE Final    Comment: (NOTE) The Xpert Xpress SARS-CoV-2/FLU/RSV plus assay is intended as an aid in the diagnosis of influenza from Nasopharyngeal swab specimens and should not be used as a sole basis for treatment. Nasal washings and aspirates are unacceptable for Xpert Xpress SARS-CoV-2/FLU/RSV testing.  Fact Sheet for Patients: EntrepreneurPulse.com.au  Fact Sheet for Healthcare Providers: IncredibleEmployment.be  This test is  not yet approved or cleared by the [place] FDA and has been authorized for detection and/or diagnosis of SARS-CoV-2 by FDA under an Emergency Use Authorization (EUA). This EUA will remain in effect (meaning this test can be used) for the duration of the COVID-19 declaration under Section 564(b)(1) of the Act, 21 U.S.C. section 360bbb-3(b)(1), unless the authorization is terminated or revoked.  Performed at [facility], [address] [place], [place] [address]   Blood Culture (routine x 2)     Status: None (Preliminary result)   Collection Time: 08/02/21  1:10 PM   Specimen: BLOOD  Result Value Ref Range Status   Specimen Description BLOOD LEFT [facility]  Final   Special Requests   Final    BOTTLES DRAWN AEROBIC AND ANAEROBIC Blood Culture adequate volume   Culture   Final    NO GROWTH 2 DAYS Performed at [facility], [address], [place], [place] [address]    Report  Status PENDING  Incomplete  Blood Culture (routine x 2)     Status: None (Preliminary result)   Collection Time: 08/02/21  1:15 PM   Specimen: BLOOD  Result Value Ref Range Status   Specimen Description BLOOD BLOOD RIGHT HAND  Final   Special Requests   Final    BOTTLES DRAWN AEROBIC AND ANAEROBIC Blood Culture adequate volume   Culture   Final    NO GROWTH 2 DAYS Performed at [facility], [address], [place], [place] [address]    Report Status PENDING  Incomplete  Expectorated Sputum Assessment w Gram Stain, Rflx to Resp Cult     Status: None   Collection Time: 08/03/21  5:00 AM   Specimen: Sputum  Result Value Ref Range Status   Specimen Description SPUTUM  Final   Special Requests NONE  Final   Sputum evaluation   Final    Sputum specimen not acceptable for testing.  Please recollect.   C/KARA BRIDGES AT 9935 08/03/21.PMF Performed at [facility], [address], [place], [place] [address]    Report Status 08/03/2021 FINAL  Final     Labs: Basic  Metabolic Panel: Recent Labs  Lab 08/02/21 1126 08/02/21 1530 08/03/21 0500 08/04/21 0401  NA 135  --  136  --   K 3.2*  --  4.2  --   CL 100  --  104  --   CO2 25  --  25  --   GLUCOSE 246*  --  244*  --   BUN 13  --  19  --   CREATININE 0.79  --  0.83  --   CALCIUM 8.7*  --  8.7*  --   MG  --  1.3*  --  2.2  PHOS  --   --  3.1  --    Liver Function Tests: Recent Labs  Lab 08/02/21 1126 08/03/21 0500  AST 27 21  ALT 21 21  ALKPHOS 42 35*  BILITOT 1.1 1.4*  PROT 7.6 7.1  ALBUMIN 3.6 3.5   No results for input(s): LIPASE, AMYLASE in the last 168 hours. No results for input(s): AMMONIA in the last 168 hours. CBC: Recent Labs  Lab 08/02/21 1126 08/03/21 0500  WBC 11.1* 13.4*  NEUTROABS 8.4*  --   HGB 14.8 13.2  HCT 44.3 40.8  MCV 88.2 88.7  PLT 239 254   Cardiac Enzymes: No results for input(s): CKTOTAL, CKMB, CKMBINDEX, TROPONINI in the last 168 hours. BNP: BNP (last 3 results) Recent Labs    08/02/21 1130  BNP 109.5*    ProBNP (last 3 results) No results for input(s): PROBNP in the last 8760 hours.  CBG: Recent Labs  Lab 08/03/21 1210 08/03/21 1644 08/03/21 2106 08/04/21 0751 08/04/21 1154  GLUCAP 266* 280* 315* 246* 274*       Signed:  Kayleen Memos, MD [facility] 08/04/2021, 3:02 PM

## 2021-08-04 NOTE — Progress Notes (Signed)
SATURATION QUALIFICATIONS: (This note is used to comply with regulatory documentation for home oxygen)  Patient Saturations on Room Air at Rest = 98%  Patient Saturations on Room Air while Ambulating = 96%  Patient Saturations on 00 Liters of oxygen while Ambulating = 96%  Please briefly explain why patient needs home oxygen:  No need for home oxygen at this time

## 2021-08-04 NOTE — Plan of Care (Signed)

## 2021-08-04 NOTE — TOC Transition Note (Signed)
Transition of Care [facility]) - CM/SW Discharge Note   Patient Details  Name: Sheila Hicks MRN: [id number] Date of Birth: 1975/12/09  [facility] CM/SW Contact:  Izola Price, RN Phone Number: 08/04/2021, 10:57 AM   Clinical Narrative: Patient discharging to Home/Self care today. On Room Air at this time. Checking with Unit RN re any DME home oxygen needs. None ordered yet. Simmie Davies RN CM       Final next level of care: Home/Self Care Barriers to Discharge: Barriers Resolved   Patient Goals and CMS Choice     Choice offered to / list presented to : NA  Discharge Placement                       Discharge Plan and Services                DME Arranged: N/A DME Agency: NA       HH Arranged: NA HH Agency: NA        Social Determinants of Health (SDOH) Interventions     Readmission Risk Interventions No flowsheet data found.

## 2021-08-04 NOTE — TOC Transition Note (Addendum)
Transition of Care [facility]) - CM/SW Discharge Note   Patient Details  Name: Sheila Hicks MRN: [id number] Date of Birth: [birth year]  [facility]) CM/SW Contact:  Izola Price, RN Phone Number: 08/04/2021, 11:12 AM   Clinical Narrative: DME nebulizer ordered for discharge. [employer] notified via [place] at 1110 am this morning for delivery to patient's room prior to discharge. Update provider and Unit RN. Barbie Onix Jumper RN CM     0600 pm. Jasmine via [employer] confirmed she was able to contact patient to set up delivery. Simmie Davies RN CM   Final next level of care: Home/Self Care Barriers to Discharge: Barriers Resolved   Patient Goals and CMS Choice     Choice offered to / list presented to : NA  Discharge Placement                       Discharge Plan and Services                DME Arranged: Nebulizer machine DME Agency: [employer] Date DME Agency Contacted: 08/04/21 Time DME Agency Contacted: 1112 Representative spoke with at DME Agency: [place] at 1110 am 08/04/21. HH Arranged: NA HH Agency: NA        Social Determinants of Health (SDOH) Interventions     Readmission Risk Interventions No flowsheet data found.

## 2021-08-07 LAB — CULTURE, BLOOD (ROUTINE X 2)
Culture: NO GROWTH
Culture: NO GROWTH
Special Requests: ADEQUATE
Special Requests: ADEQUATE

## 2021-08-07 LAB — LEGIONELLA PNEUMOPHILA SEROGP 1 UR AG: L. pneumophila Serogp 1 Ur Ag: NEGATIVE

## 2021-11-07 ENCOUNTER — Ambulatory Visit: Payer: BC Managed Care – PPO | Admitting: Nurse Practitioner

## 2021-11-07 DIAGNOSIS — T782XXA Anaphylactic shock, unspecified, initial encounter: Secondary | ICD-10-CM

## 2021-11-07 MED ORDER — EPINEPHRINE 0.3 MG/0.3ML IJ SOAJ: 0.3000 mg | INTRAMUSCULAR | 3 refills | Status: AC | PRN

## 2021-11-07 NOTE — Progress Notes (Signed)
? ?Virtual Visit Consent  ? ?[name], you are scheduled for a virtual visit with a [place] provider today.   ?  ?Just as with appointments in the office, your consent must be obtained to participate.  Your consent will be active for this visit and any virtual visit you may have with one of our providers in the next 365 days.   ? ?I need to obtain your verbal consent now.   Are you willing to proceed with your visit today?  ?  ?Sheila Hicks has provided verbal consent on 11/07/2021 for a virtual visit (telephone). ?  ?Apolonio Schneiders, FNP  ? ?Date: 11/07/2021 3:04 PM ? ? ?Virtual Visit via Video Note  ? ?IApolonio Schneiders, connected with  Sheila Hicks  ([id number], 1975/12/12) on 11/07/21 at  3:00 PM EDT by a video-enabled telemedicine application and verified that I am speaking with the correct person using two identifiers. ? ?Location: ?Patient: Virtual Visit Location Patient: Home ?Provider: Virtual Visit Location Provider: Office/Clinic ?  ?I discussed the limitations of evaluation and management by telemedicine and the availability of in person appointments. The patient expressed understanding and agreed to proceed.   ? ?History of Present Illness: ?Sheila Hicks is a 46 y.o. who identifies as a female who was assigned female at birth, and is being seen today for request on refill of epinephrine pen. 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   ?Requesting a refill on that today  ? ?Has not had exposure in the past  ? ?Problems:  ?Patient Active Problem List  ? Diagnosis Date Noted  ? Asthma exacerbation 08/02/2021  ? Acute respiratory failure with hypoxia ([facility]) 08/02/2021  ? Elevated d-dimer 08/02/2021  ? Lactic acidosis 08/02/2021  ? Hypokalemia 08/02/2021  ? Elevated brain natriuretic  peptide (BNP) level 08/02/2021  ? Hyperglycemia due to diabetes mellitus ([facility]) 08/02/2021  ? CAP (community acquired pneumonia) 08/02/2021  ? Mixed hyperlipidemia 08/02/2021  ? Prolonged QT interval 08/02/2021  ? Obesity, Class III, BMI 40-49.9 (morbid obesity) ([facility]) 04/07/2019  ? Morbid obesity ([facility]) 04/07/2019  ? Dyspnea on exertion 01/29/2019  ? Edema, lower extremity 01/29/2019  ? Blood loss anemia 07/16/2018  ? Facet hypertrophy 07/16/2018  ? Abnormal uterine bleeding 07/16/2018  ? 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 Sprain, lumbosacral 03/26/2018  ? Segmental dysfunction of lumbar region 08/13/2017  ? Muscle spasm of back 08/13/2017  ? Segmental dysfunction of cervical region 08/13/2017  ? Segmental dysfunction of thoracic region 08/13/2017  ? History of congestive heart failure 05/22/2016  ? Prediabetes 05/16/2016  ? Vaginal bleeding 05/16/2016  ? Low HDL (under 40) 03/21/2014  ? Essential hypertension 02/21/2014  ? Morbid obesity ([place]) 02/21/2014  ? History of depression 02/21/2014  ?  ?Allergies: No Known Allergies ?Medications:  ?Current Outpatient Medications:  ?  EPINEPHrine 0.3 mg/0.3 mL IJ SOAJ injection, Inject 0.3 mg into the muscle as needed for up to 1 day for anaphylaxis (bee sting)., Disp: 1 each, Rfl: 3 ?  albuterol (PROVENTIL) (2.5 MG/3ML) 0.083% nebulizer solution, Take 3 mLs (2.5 mg total) by nebulization every 4 (four) hours as needed for wheezing or shortness of breath., Disp: 75 mL, Rfl: 0 ?  albuterol (VENTOLIN HFA)  108 (90 Base) MCG/ACT inhaler, Inhale 1-2 puffs into the lungs every 6 (six) hours as needed for wheezing or shortness of breath., Disp: , Rfl:  ?  Ascorbic Acid (VITAMIN C) 1000 MG tablet, Take 1,000 mg by mouth daily., Disp: , Rfl:  ?  atorvastatin (LIPITOR) 20 MG tablet, Take 20 mg by mouth daily., Disp: , Rfl:  ?  benzonatate (TESSALON) 200 MG capsule, Take 1 capsule (200 mg total) by mouth 3 (three) times daily., Disp: 20 capsule, Rfl: 0 ?  BREO ELLIPTA  200-25 MCG/ACT AEPB, Inhale 1 puff into the lungs daily., Disp: , Rfl:  ?  dapagliflozin propanediol (FARXIGA) 10 MG TABS tablet, Take by mouth daily., Disp: , Rfl:  ?  furosemide (LASIX) 20 MG tablet, Take 1 tablet (20 mg total) by mouth 2 (two) times daily., Disp: 60 tablet, Rfl: 11 ?  lisinopril (ZESTRIL) 20 MG tablet, Take 20 mg by mouth daily., Disp: , Rfl:  ?  meloxicam (MOBIC) 15 MG tablet, Take 1 tablet (15 mg total) by mouth daily., Disp: 90 tablet, Rfl: 2 ?  Multiple Vitamin (MULTIVITAMIN WITH MINERALS) TABS tablet, Take 1 tablet by mouth daily., Disp: 90 tablet, Rfl: 2 ?  pantoprazole (PROTONIX) 40 MG tablet, Take 1 tablet (40 mg total) by mouth daily., Disp: 30 tablet, Rfl: 0 ?  tirzepatide (MOUNJARO) 5 MG/0.5ML Pen, Inject 5 mg into the skin once a week. Patient states she started medcaiton 6 weeks ago. Used for 4 weeks and not taken in last 2 weeks b/c her pharmacy was out of stock.She states they called and let her know they filled the medication yesterday., Disp: , Rfl:  ? ?Observations/Objective: ?No physical exam performed, she  ? ?Assessment and Plan: ?1. Anaphylaxis, initial encounter ?- EPINEPHrine 0.3 mg/0.3 mL IJ SOAJ injection; Inject 0.3 mg into the muscle as needed for up to 1 day for anaphylaxis (bee sting).  Dispense: 1 each; Refill: 3 ? ?   ? ?Follow Up Instructions: ?I discussed the assessment and treatment plan with the patient. The patient was provided an opportunity to ask questions and all were answered. The patient agreed with the plan and demonstrated an understanding of the instructions.  A copy of instructions were sent to the patient via MyChart unless otherwise noted below.  ? ? ?The patient was advised to call back or seek an in-person evaluation if the symptoms worsen or if the condition fails to improve as anticipated. ? ?Time:  ?I spent 10 minutes with the patient via telehealth technology discussing the above problems/concerns.   ? ?Apolonio Schneiders, FNP ? ?

## 2022-01-01 ENCOUNTER — Ambulatory Visit: Payer: BC Managed Care – PPO | Admitting: Medical

## 2022-01-01 NOTE — Progress Notes (Unsigned)
   Subjective:    Patient ID: Sheila Hicks, female    DOB: Aug 29, 1975, 46 y.o.   MRN: [id number]  HPI  Accidentally clicked to open chart, patient cancelled appointment.  Review of Systems     Objective:   Physical Exam        Assessment & Plan:

## 2022-01-08 ENCOUNTER — Ambulatory Visit: Payer: BC Managed Care – PPO | Admitting: Nurse Practitioner

## 2022-03-19 ENCOUNTER — Ambulatory Visit (INDEPENDENT_AMBULATORY_CARE_PROVIDER_SITE_OTHER): Payer: Self-pay | Admitting: Family

## 2022-03-19 VITALS — BP 160/100 | HR 70 | Temp 99.3°F

## 2022-03-19 DIAGNOSIS — J069 Acute upper respiratory infection, unspecified: Secondary | ICD-10-CM

## 2022-03-19 NOTE — Progress Notes (Signed)
[place] [place], [place] [address]   Office Visit Note  Patient Name: Sheila Hicks  [id number]  [id number]  Date of Service: 03/19/2022  Chief Complaint  Patient presents with   URI     URI  Associated symptoms include congestion, ear pain and a sore throat.   Pt presents with c/o left ear pressure, sore throat and drainage x 5-6 days.  Using salt water gargles with help.  Takes Rx Meloxicam for back, no other otc meds for support.  No fevers.       Current Medication:  Outpatient Encounter Medications as of 03/19/2022  Medication Sig   albuterol (PROVENTIL) (2.5 MG/3ML) 0.083% nebulizer solution Take 3 mLs (2.5 mg total) by nebulization every 4 (four) hours as needed for wheezing or shortness of breath.   albuterol (VENTOLIN HFA) 108 (90 Base) MCG/ACT inhaler Inhale 1-2 puffs into the lungs every 6 (six) hours as needed for wheezing or shortness of breath.   Ascorbic Acid (VITAMIN C) 1000 MG tablet Take 1,000 mg by mouth daily.   atorvastatin (LIPITOR) 20 MG tablet Take 20 mg by mouth daily.   benzonatate (TESSALON) 200 MG capsule Take 1 capsule (200 mg total) by mouth 3 (three) times daily.   BREO ELLIPTA 200-25 MCG/ACT AEPB Inhale 1 puff into the lungs daily.   dapagliflozin propanediol (FARXIGA) 10 MG TABS tablet Take by mouth daily.   furosemide (LASIX) 20 MG tablet Take 1 tablet (20 mg total) by mouth 2 (two) times daily.   lisinopril (ZESTRIL) 20 MG tablet Take 20 mg by mouth daily.   meloxicam (MOBIC) 15 MG tablet Take 1 tablet (15 mg total) by mouth daily.   Multiple Vitamin (MULTIVITAMIN WITH MINERALS) TABS tablet Take 1 tablet by mouth daily.   pantoprazole (PROTONIX) 40 MG tablet Take 1 tablet (40 mg total) by mouth daily.   tirzepatide [facility]) 5 MG/0.5ML Pen Inject 5 mg into the skin once a week. Patient states she started medcaiton 6 weeks ago. Used for 4 weeks and not taken in last 2 weeks b/c her pharmacy was out of stock.She  states they called and let her know they filled the medication yesterday.   No facility-administered encounter medications on file as of 03/19/2022.      Medical History: Past Medical History:  Diagnosis Date   Arrhythmia    Diabetes mellitus without complication (HCC)    Heart murmur    LBBB   History of depression    History of hay fever    History of hypertension    Hypertension      Vital Signs: BP (!) 160/100 (BP Location: Left Arm)   Pulse 70   Temp 99.3 F (37.4 C) (Tympanic)   SpO2 98%    Review of Systems  Constitutional: Negative.   HENT:  Positive for congestion, ear pain, postnasal drip and sore throat.   Respiratory: Negative.    Skin: Negative.     Physical Exam Constitutional:      Appearance: Normal appearance.  HENT:     Right Ear: Tympanic membrane, ear canal and external ear normal.     Left Ear: Tympanic membrane, ear canal and external ear normal. Impacted cerumen: upper respiratory.     Nose:     Comments: Nasal mucosa inflamed.    Mouth/Throat:     Mouth: Mucous membranes are moist.     Pharynx: Posterior oropharyngeal erythema present.     Comments: Erythema to right side of pharynx,  no tonsillar swelling or exudate. Pulmonary:     Effort: Pulmonary effort is normal.     Breath sounds: Normal breath sounds.  Musculoskeletal:     Cervical back: Normal range of motion and neck supple.  Lymphadenopathy:     Cervical: No cervical adenopathy.  Skin:    General: Skin is warm and dry.  Neurological:     Mental Status: She is alert.       Assessment/Plan: Reviewed exam findings consistent with viral illness.  Hydrate and rest.  Expect symptoms to improve at 7-10 day mark.  Start otc  Flonase.  Add otc Tylenol as needed for breakthrough pain.  Cont salt water gargles.  Hold Augmentin for worsening symptoms after 10 days of persistent symptoms.  Discussed elevated BP, pt states that "she has never had high BP."  She will monitor  BP.  General Counseling: May verbalizes understanding of the findings of todays visit and agrees with plan of treatment. I have discussed any further diagnostic evaluation that may be needed or ordered today. We also reviewed her medications today. she has been encouraged to call the office with any questions or concerns that should arise related to todays visit.   Time spent: 15 Minutes  Wardell Honour, FNP-C

## 2022-03-20 ENCOUNTER — Other Ambulatory Visit: Payer: Self-pay | Admitting: Nurse Practitioner

## 2022-03-20 ENCOUNTER — Encounter: Payer: Self-pay | Admitting: Family

## 2022-03-20 DIAGNOSIS — J0110 Acute frontal sinusitis, unspecified: Secondary | ICD-10-CM

## 2022-03-20 DIAGNOSIS — J029 Acute pharyngitis, unspecified: Secondary | ICD-10-CM

## 2022-03-20 MED ORDER — AMOXICILLIN-POT CLAVULANATE 875-125 MG PO TABS: 1.0000 | ORAL_TABLET | Freq: Two times a day (BID) | ORAL | 0 refills | Status: AC

## 2022-03-20 NOTE — Progress Notes (Signed)
Antibiotic prescribed per office visit yesterday and patient's worsening symptoms

## 2022-06-27 ENCOUNTER — Encounter: Payer: Self-pay | Admitting: Nurse Practitioner

## 2022-06-27 ENCOUNTER — Ambulatory Visit (INDEPENDENT_AMBULATORY_CARE_PROVIDER_SITE_OTHER): Payer: Self-pay | Admitting: Nurse Practitioner

## 2022-06-27 VITALS — BP 124/82 | HR 80 | Temp 98.0°F | Ht 68.5 in | Wt 262.0 lb

## 2022-06-27 DIAGNOSIS — J0140 Acute pansinusitis, unspecified: Secondary | ICD-10-CM

## 2022-06-27 MED ORDER — AMOXICILLIN-POT CLAVULANATE 875-125 MG PO TABS: 1.0000 | ORAL_TABLET | Freq: Two times a day (BID) | ORAL | 0 refills | Status: AC

## 2022-06-27 NOTE — Progress Notes (Signed)
[facility] [address], [place] [address] [phone]  Office Visit Note  Patient Name: Sheila Hicks Date of Birth [id number]  Medical Record number [id number]  Date of Service: 06/27/2022  Chief Complaint  Patient presents with   Sinusitis    Started last Fri. Cough, runny nose. Mucus is clear/yellowish with blood mixed in. Body aches last night, 100 temp last night. Throat is "tender" COVID test neg. Ears      HPI  46 year old female with complaints of worsening sinus congestion and drainage for the past week.  She has been using Dayquil   She has taken two COVID tests that were negative since start of illness   Sinus pressure with bloody noses  Not using any nasal sprays   Uses Breo inhaler daily  She does have a rescue inhaler (Albuterol) hasn't needed since 1/23 She does have a cough but does not feel that the drainage has reached her chest  Denies wheezing   Current Medication:  Outpatient Encounter Medications as of 06/27/2022  Medication Sig   Ascorbic Acid (VITAMIN C) 1000 MG tablet Take 1,000 mg by mouth daily.   atorvastatin (LIPITOR) 20 MG tablet Take 20 mg by mouth daily.   BREO ELLIPTA 200-25 MCG/ACT AEPB Inhale 1 puff into the lungs daily.   lisinopril (ZESTRIL) 20 MG tablet Take 20 mg by mouth daily.   meloxicam (MOBIC) 15 MG tablet Take 1 tablet (15 mg total) by mouth daily.   Multiple Vitamin (MULTIVITAMIN WITH MINERALS) TABS tablet Take 1 tablet by mouth daily.   tirzepatide [facility]) 5 MG/0.5ML Pen Inject 5 mg into the skin once a week. Patient states she started medcaiton 6 weeks ago. Used for 4 weeks and not taken in last 2 weeks b/c her pharmacy was out of stock.She states they called and let her know they filled the medication yesterday.   albuterol (PROVENTIL) (2.5 MG/3ML) 0.083% nebulizer solution Take 3 mLs (2.5 mg total) by nebulization every 4 (four) hours as needed for wheezing or shortness of breath. (Patient not  taking: Reported on 06/27/2022)   albuterol (VENTOLIN HFA) 108 (90 Base) MCG/ACT inhaler Inhale 1-2 puffs into the lungs every 6 (six) hours as needed for wheezing or shortness of breath.   benzonatate (TESSALON) 200 MG capsule Take 1 capsule (200 mg total) by mouth 3 (three) times daily. (Patient not taking: Reported on 06/27/2022)   dapagliflozin propanediol (FARXIGA) 10 MG TABS tablet Take by mouth daily. (Patient not taking: Reported on 06/27/2022)   furosemide (LASIX) 20 MG tablet Take 1 tablet (20 mg total) by mouth 2 (two) times daily.   pantoprazole (PROTONIX) 40 MG tablet Take 1 tablet (40 mg total) by mouth daily.   No facility-administered encounter medications on file as of 06/27/2022.      Medical History: Past Medical History:  Diagnosis Date   Arrhythmia    Diabetes mellitus without complication ([place])    Heart murmur    LBBB   History of depression    History of hay fever    History of hypertension    Hypertension      Vital Signs: BP 124/82 (BP Location: Left Arm, Patient Position: Sitting, Cuff Size: Normal)   Pulse 80   Temp 98 F (36.7 C) (Tympanic)   Ht 5' 8.5" (1.74 m)   Wt 262 lb (118.8 kg)   SpO2 99%   BMI 39.26 kg/m    Review of Systems  Constitutional:  Positive for fatigue.  HENT:  Positive for congestion, sinus pressure and sinus pain.   Eyes: Negative.   Respiratory:  Positive for cough. Negative for wheezing.   Cardiovascular: Negative.   Gastrointestinal: Negative.   Genitourinary: Negative.   Musculoskeletal: Negative.   Neurological: Negative.        Physical Exam HENT:     Head: Normocephalic.     Right Ear: A middle ear effusion is present.     Left Ear: A middle ear effusion is present.     Nose: Congestion present.     Mouth/Throat:     Lips: Pink.     Mouth: Mucous membranes are moist.     Pharynx: Posterior oropharyngeal erythema present.  Eyes:     Pupils: Pupils are equal, round, and reactive to light.   Cardiovascular:     Rate and Rhythm: Normal rate and regular rhythm.     Heart sounds: Normal heart sounds.  Pulmonary:     Effort: Pulmonary effort is normal.     Breath sounds: Normal breath sounds.  Musculoskeletal:     Cervical back: Normal range of motion.  Skin:    General: Skin is warm.  Neurological:     General: No focal deficit present.     Mental Status: She is alert and oriented to person, place, and time. Mental status is at baseline.  Psychiatric:        Mood and Affect: Mood normal.       Assessment/Plan: 1. Acute non-recurrent pansinusitis Start Mucinex OTC   - amoxicillin-clavulanate (AUGMENTIN) 875-125 MG tablet; Take 1 tablet by mouth 2 (two) times daily for 7 days. Take with food  Dispense: 14 tablet; Refill: 0    General Counseling: Sheila Hicks verbalizes understanding of the findings of todays visit and agrees with plan of treatment. I have discussed any further diagnostic evaluation that may be needed or ordered today. We also reviewed her medications today. she has been encouraged to call the office with any questions or concerns that should arise related to todays visit.    Time spent:20 Minutes   Apolonio Schneiders [facility] Family Nurse Practitioner

## 2022-07-08 ENCOUNTER — Ambulatory Visit: Payer: BC Managed Care – PPO | Admitting: Nurse Practitioner

## 2022-07-09 ENCOUNTER — Other Ambulatory Visit (INDEPENDENT_AMBULATORY_CARE_PROVIDER_SITE_OTHER): Payer: Self-pay

## 2022-07-09 VITALS — Temp 98.4°F

## 2022-07-09 DIAGNOSIS — Z0184 Encounter for antibody response examination: Secondary | ICD-10-CM

## 2022-07-09 DIAGNOSIS — Z23 Encounter for immunization: Secondary | ICD-10-CM

## 2022-07-09 NOTE — Progress Notes (Signed)
[facility] [address], [place] [address]   Office Visit Note  Patient Name: Sheila Hicks Date of Birth [id number]  Medical Record number [id number]  Date of Service: 07/09/2022  Chief Complaint  Patient presents with   Labs Only    Wants to get titers for HepB, Varicella, MMR? And would like Tdap/TD vaccine.      46 y/o F presents to the clinic for vaccine status. Pt is an employee and will also attend [employer] and has been requested to submit her vaccine status. She is not sure which vaccine she has had in the past. Her last Tdap was in 07/2011. She has tried contacting the [facility], but hasn't heard back yet.       Current Medication:  Outpatient Encounter Medications as of 07/09/2022  Medication Sig   Ascorbic Acid (VITAMIN C) 1000 MG tablet Take 1,000 mg by mouth daily.   atorvastatin (LIPITOR) 20 MG tablet Take 20 mg by mouth daily.   BREO ELLIPTA 200-25 MCG/ACT AEPB Inhale 1 puff into the lungs daily.   furosemide (LASIX) 20 MG tablet Take 1 tablet by mouth 2 (two) times daily.   lisinopril (ZESTRIL) 20 MG tablet Take 20 mg by mouth daily.   meloxicam (MOBIC) 15 MG tablet Take 1 tablet (15 mg total) by mouth daily.   Multiple Vitamin (MULTIVITAMIN WITH MINERALS) TABS tablet Take 1 tablet by mouth daily.   tirzepatide [facility]) 5 MG/0.5ML Pen Inject 5 mg into the skin once a week. Patient states she started medcaiton 6 weeks ago. Used for 4 weeks and not taken in last 2 weeks b/c her pharmacy was out of stock.She states they called and let her know they filled the medication yesterday.   albuterol (PROVENTIL) (2.5 MG/3ML) 0.083% nebulizer solution Take 3 mLs (2.5 mg total) by nebulization every 4 (four) hours as needed for wheezing or shortness of breath. (Patient not taking: Reported on 06/27/2022)   albuterol (VENTOLIN HFA) 108 (90 Base) MCG/ACT inhaler Inhale 1-2 puffs into the lungs every 6 (six) hours as needed for wheezing or  shortness of breath.   benzonatate (TESSALON) 200 MG capsule Take 1 capsule (200 mg total) by mouth 3 (three) times daily. (Patient not taking: Reported on 06/27/2022)   dapagliflozin propanediol (FARXIGA) 10 MG TABS tablet Take by mouth daily. (Patient not taking: Reported on 06/27/2022)   furosemide (LASIX) 20 MG tablet Take 1 tablet (20 mg total) by mouth 2 (two) times daily.   pantoprazole (PROTONIX) 40 MG tablet Take 1 tablet (40 mg total) by mouth daily.   No facility-administered encounter medications on file as of 07/09/2022.      Medical History: Past Medical History:  Diagnosis Date   Arrhythmia    Diabetes mellitus without complication ([place])    Heart murmur    LBBB   History of depression    History of hay fever    History of hypertension    Hypertension      Vital Signs: Temp 98.4 F (36.9 C) (Tympanic)    Review of Systems  Constitutional: Negative.     Physical Exam    Assessment/Plan:  1. Immunity status testing - Varicella zoster antibody, IgG - Hepatitis B surface antibody,quantitative - Measles/Mumps/Rubella Immunity  2. Need for Tdap vaccination - Tdap vaccine greater than or equal to 7yo IM  Await lab results.   General Counseling: Donnelle verbalizes understanding of the findings of todays visit and agrees with plan of treatment.  I have discussed any further diagnostic evaluation that may be needed or ordered today. We also reviewed her medications today. she has been encouraged to call the office with any questions or concerns that should arise related to todays visit.    Time spent:20 [place], [place] Physician Assistant

## 2022-07-10 LAB — MEASLES/MUMPS/RUBELLA IMMUNITY
MUMPS ABS, IGG: 61.2 AU/mL (ref >10.9)
RUBEOLA AB, IGG: 173 AU/mL (ref >16.4)
Rubella Antibodies, IGG: 1.76 index (ref >0.99)

## 2022-07-10 LAB — HEPATITIS B SURFACE ANTIBODY, QUANTITATIVE: Hepatitis B Surf Ab Quant: <3.1 m[IU]/mL — ABNORMAL LOW (ref >9.9)

## 2022-07-10 LAB — VARICELLA ZOSTER ANTIBODY, IGG: Varicella zoster IgG: 1233 index (ref >165)

## 2022-07-26 ENCOUNTER — Ambulatory Visit: Payer: BC Managed Care – PPO | Admitting: Nurse Practitioner

## 2022-08-06 ENCOUNTER — Ambulatory Visit: Payer: BC Managed Care – PPO

## 2022-10-30 ENCOUNTER — Ambulatory Visit: Payer: BC Managed Care – PPO | Admitting: Adult Health

## 2022-11-01 ENCOUNTER — Ambulatory Visit (INDEPENDENT_AMBULATORY_CARE_PROVIDER_SITE_OTHER): Payer: Self-pay | Admitting: Adult Health

## 2022-11-01 VITALS — BP 122/80 | HR 82 | Temp 97.0°F | Resp 18

## 2022-11-01 DIAGNOSIS — Z029 Encounter for administrative examinations, unspecified: Secondary | ICD-10-CM

## 2022-11-01 NOTE — Progress Notes (Signed)
[facility] [address] [place], [place] [id number]   Office Visit Note  Patient Name: Sheila SaxDanielle L Atkison Date of Birth [birth year]  Medical Record number [id number]  Date of Service: 11/01/2022  Chief Complaint  Patient presents with   Employment Physical     HPI Pt is here for physical.  She is a 47 year old female.  She has a history of Arthritis in her back, HTN, and HLD.  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  She is currently walking 20 minutes 3 times a week.     Current Medication:  Outpatient Encounter Medications as of 11/01/2022  Medication Sig   albuterol (PROVENTIL) (2.5 MG/3ML) 0.083% nebulizer solution Take 3 mLs (2.5 mg total) by nebulization every 4 (four) hours as needed for wheezing or shortness of breath. (Patient not taking: Reported on 06/27/2022)   albuterol (VENTOLIN HFA) 108 (90 Base) MCG/ACT inhaler Inhale 1-2 puffs into the lungs every 6 (six) hours as needed for wheezing or shortness of breath.   Ascorbic Acid (VITAMIN C) 1000 MG tablet Take 1,000 mg by mouth daily.   atorvastatin (LIPITOR) 20 MG tablet Take 20 mg by mouth daily.   benzonatate (TESSALON) 200 MG capsule Take 1 capsule (200 mg total) by mouth 3 (three) times daily. (Patient not taking: Reported on 06/27/2022)   BREO ELLIPTA 200-25 MCG/ACT AEPB Inhale 1 puff into the lungs daily.   dapagliflozin propanediol (FARXIGA) 10 MG TABS tablet Take by mouth daily. (Patient not taking: Reported on 06/27/2022)   furosemide (LASIX) 20 MG tablet Take 1 tablet (20 mg total) by mouth 2 (two) times daily.   furosemide (LASIX) 20 MG tablet Take 1 tablet by mouth 2 (two) times daily.   lisinopril (ZESTRIL) 20 MG tablet Take 20 mg by mouth daily.   meloxicam (MOBIC) 15 MG tablet Take 1 tablet (15 mg total) by mouth daily.   Multiple Vitamin (MULTIVITAMIN WITH MINERALS) TABS tablet Take 1 tablet by mouth daily.   pantoprazole (PROTONIX) 40 MG tablet Take 1 tablet (40 mg total) by mouth daily.    tirzepatide [facility](MOUNJARO) 5 MG/0.5ML Pen Inject 5 mg into the skin once a week. Patient states she started medcaiton 6 weeks ago. Used for 4 weeks and not taken in last 2 weeks b/c her pharmacy was out of stock.She states they called and let her know they filled the medication yesterday.   No facility-administered encounter medications on file as of 11/01/2022.      Medical History: Past Medical History:  Diagnosis Date   Arrhythmia    Diabetes mellitus without complication (HCC)    Heart murmur    LBBB   History of depression    History of hay fever    History of hypertension    Hypertension      Vital Signs: BP 122/80   Pulse 82   Temp (!) 97 F (36.1 C)   Resp 18   SpO2 96%    Review of Systems  Constitutional:  Negative for chills, fatigue and fever.  HENT:  Negative for postnasal drip and sinus pain.   Eyes:  Negative for pain and itching.  Respiratory:  Negative for cough.   Cardiovascular:  Negative for chest pain.  Gastrointestinal:  Negative for diarrhea, nausea and vomiting.  Neurological:  Negative for dizziness, numbness and headaches.    Physical Exam Vitals and nursing note reviewed.  Constitutional:      Appearance: Normal appearance.  HENT:  Head: Normocephalic.     Right Ear: Tympanic membrane and ear canal normal.     Left Ear: Tympanic membrane and ear canal normal.     Nose: Nose normal.     Mouth/Throat:     Mouth: Mucous membranes are moist.  Eyes:     Pupils: Pupils are equal, round, and reactive to light.  Cardiovascular:     Rate and Rhythm: Normal rate.  Pulmonary:     Effort: Pulmonary effort is normal.     Breath sounds: Normal breath sounds.  Abdominal:     General: Abdomen is flat.  Musculoskeletal:        General: Normal range of motion.  Lymphadenopathy:     Cervical: No cervical adenopathy.  Neurological:     Mental Status: She is alert.    Assessment/Plan: 1. Encounter for administrative examinations Physical  performed.  No issues at this time.  Clear for school/classes.      General Counseling: Tasharra verbalizes understanding of the findings of todays visit and agrees with plan of treatment. I have discussed any further diagnostic evaluation that may be needed or ordered today. We also reviewed her medications today. she has been encouraged to call the office with any questions or concerns that should arise related to todays visit.   No orders of the defined types were placed in this encounter.   No orders of the defined types were placed in this encounter.   Time spent:25 Minutes    Johnna Acosta AGNP-C Nurse Practitioner

## 2023-04-18 ENCOUNTER — Ambulatory Visit (INDEPENDENT_AMBULATORY_CARE_PROVIDER_SITE_OTHER): Payer: Self-pay | Admitting: Adult Health

## 2023-04-18 ENCOUNTER — Encounter: Payer: Self-pay | Admitting: Adult Health

## 2023-04-18 ENCOUNTER — Other Ambulatory Visit: Payer: Self-pay

## 2023-04-18 VITALS — BP 120/80 | HR 83 | Temp 98.0°F

## 2023-04-18 DIAGNOSIS — R051 Acute cough: Secondary | ICD-10-CM
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
## 2023-05-26 ENCOUNTER — Other Ambulatory Visit
Admission: RE | Admit: 2023-05-26 | Discharge: 2023-05-26 | Disposition: A | Discharge status: Home / Self Care | Payer: BC Managed Care – PPO | Source: Home / Self Care | Attending: Pulmonary Disease | Admitting: Pulmonary Disease

## 2023-05-26 ENCOUNTER — Ambulatory Visit
Admission: RE | Admit: 2023-05-26 | Discharge: 2023-05-26 | Disposition: A | Discharge status: Home / Self Care | Payer: BC Managed Care – PPO | Source: Ambulatory Visit | Attending: Pulmonary Disease | Admitting: Pulmonary Disease

## 2023-05-26 ENCOUNTER — Ambulatory Visit: Payer: BC Managed Care – PPO | Admitting: Pulmonary Disease

## 2023-05-26 ENCOUNTER — Encounter: Payer: Self-pay | Admitting: Pulmonary Disease

## 2023-05-26 ENCOUNTER — Ambulatory Visit
Admission: RE | Admit: 2023-05-26 | Discharge: 2023-05-26 | Disposition: A | Discharge status: Home / Self Care | Payer: BC Managed Care – PPO | Attending: Pulmonary Disease | Admitting: Pulmonary Disease

## 2023-05-26 VITALS — BP 124/70 | HR 75 | Temp 97.7°F | Ht 67.0 in | Wt 265.0 lb

## 2023-05-26 DIAGNOSIS — I517 Cardiomegaly: Secondary | ICD-10-CM | POA: Insufficient documentation

## 2023-05-26 DIAGNOSIS — R053 Chronic cough: Secondary | ICD-10-CM | POA: Insufficient documentation

## 2023-05-26 LAB — CBC WITH DIFFERENTIAL/PLATELET
Abs Immature Granulocytes: 0.04 10*3/uL (ref 0.00–0.07)
Basophils Absolute: 0.1 10*3/uL (ref 0.0–0.1)
Basophils Relative: 1 %
Eosinophils Absolute: 0.3 10*3/uL (ref 0.0–0.5)
Eosinophils Relative: 2 %
HCT: 43.7 % (ref 36.0–46.0)
Hemoglobin: 14.5 g/dL (ref 12.0–15.0)
Immature Granulocytes: 0 %
Lymphocytes Relative: 33 %
Lymphs Abs: 3.5 10*3/uL (ref 0.7–4.0)
MCH: 28.6 pg (ref 26.0–34.0)
MCHC: 33.2 g/dL (ref 30.0–36.0)
MCV: 86.2 fL (ref 80.0–100.0)
Monocytes Absolute: 0.8 10*3/uL (ref 0.1–1.0)
Monocytes Relative: 7 %
Neutro Abs: 6.1 10*3/uL (ref 1.7–7.7)
Neutrophils Relative %: 57 %
Platelets: 231 10*3/uL (ref 150–400)
RBC: 5.07 MIL/uL (ref 3.87–5.11)
RDW: 13.2 % (ref 11.5–15.5)
WBC: 10.7 10*3/uL — ABNORMAL HIGH (ref 4.0–10.5)
nRBC: 0 % (ref 0.0–0.2)
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
## 2023-05-29 LAB — ALLERGEN PANEL (27) + IGE
Alternaria Alternata IgE: <0.1 kU/L
Aspergillus Fumigatus IgE: <0.1 kU/L
Bahia Grass IgE: <0.1 kU/L
Bermuda Grass IgE: <0.1 kU/L
Cat Dander IgE: <0.1 kU/L
Cedar, Mountain IgE: <0.1 kU/L
Cladosporium Herbarum IgE: <0.1 kU/L
Cocklebur IgE: <0.1 kU/L
Cockroach, American IgE: <0.1 kU/L
Common Silver Birch IgE: <0.1 kU/L
D Farinae IgE: <0.1 kU/L
D Pteronyssinus IgE: <0.1 kU/L
Dog Dander IgE: <0.1 kU/L
Elm, American IgE: <0.1 kU/L
Hickory, White IgE: <0.1 kU/L
IgE (Immunoglobulin E), Serum: 2 [IU]/mL — ABNORMAL LOW (ref 6–495)
Johnson Grass IgE: <0.1 kU/L
Kentucky Bluegrass IgE: <0.1 kU/L
Maple/Box Elder IgE: <0.1 kU/L
Mucor Racemosus IgE: <0.1 kU/L
Oak, White IgE: <0.1 kU/L
Penicillium Chrysogen IgE: <0.1 kU/L
Pigweed, Rough IgE: <0.1 kU/L
Plantain, English IgE: <0.1 kU/L
Ragweed, Short IgE: <0.1 kU/L
Setomelanomma Rostrat: <0.1 kU/L
Timothy Grass IgE: <0.1 kU/L
White Mulberry IgE: <0.1 kU/L

## 2023-06-30 ENCOUNTER — Ambulatory Visit (INDEPENDENT_AMBULATORY_CARE_PROVIDER_SITE_OTHER): Payer: Self-pay | Admitting: Physician Assistant

## 2023-06-30 ENCOUNTER — Encounter: Payer: Self-pay | Admitting: Physician Assistant

## 2023-06-30 ENCOUNTER — Other Ambulatory Visit: Payer: Self-pay

## 2023-06-30 VITALS — BP 120/86 | HR 79 | Ht 68.0 in | Wt 263.0 lb

## 2023-06-30 DIAGNOSIS — H9202 Otalgia, left ear: Secondary | ICD-10-CM

## 2023-06-30 NOTE — Progress Notes (Signed)
[facility] [address] [place], [place] [address]   Office Visit Note  Patient Name: Sheila Hicks Date of Birth [id number]  Medical Record number [id number]  Date of Service: 06/30/2023  Chief Complaint  Patient presents with   Acute Visit    Patient reports feeling minor pressure in her L ear and has been hearing a "crackling" sound for about 2 weeks. She states her hearing has been muffled in her L ear.     47 y/o F presents to the clinic for c/o left ear discomfort x few days. Denies pain. No ear drainage. No cold symptoms. No recent air travel. NO recent open water activities. Denies ringing in the ear or dizziness.       Current Medication:  Outpatient Encounter Medications as of 06/30/2023  Medication Sig   Ascorbic Acid (VITAMIN C) 1000 MG tablet Take 1,000 mg by mouth. occasionally   b complex vitamins capsule Take 1 capsule by mouth daily.   BREO ELLIPTA 200-25 MCG/ACT AEPB Inhale 1 puff into the lungs daily.   CRANBERRY PO Take by mouth daily.   furosemide (LASIX) 20 MG tablet Take 1 tablet (20 mg total) by mouth 2 (two) times daily.   meloxicam (MOBIC) 15 MG tablet Take 1 tablet (15 mg total) by mouth daily.   MOUNJARO 12.5 MG/0.5ML Pen Inject 12.5 mg into the skin once a week.   Multiple Vitamin (MULTIVITAMIN WITH MINERALS) TABS tablet Take 1 tablet by mouth daily.   VITAMIN D-VITAMIN K PO Take by mouth daily.   [DISCONTINUED] atorvastatin (LIPITOR) 20 MG tablet Take 20 mg by mouth daily.   [DISCONTINUED] lisinopril (ZESTRIL) 20 MG tablet Take 20 mg by mouth daily.   [DISCONTINUED] tirzepatide [facility]) 5 MG/0.5ML Pen Inject 5 mg into the skin once a week. Patient states she started medcaiton 6 weeks ago. Used for 4 weeks and not taken in last 2 weeks b/c her pharmacy was out of stock.She states they called and let her know they filled the medication yesterday.   No facility-administered encounter medications on file as of 06/30/2023.       Medical History: Past Medical History:  Diagnosis Date   Arrhythmia    Diabetes mellitus without complication (HCC)    Heart murmur    LBBB   History of depression    History of hay fever    History of hypertension    Hypertension      Vital Signs: BP 120/86   Pulse 79   Ht 5\' 8"  (1.727 m)   Wt 263 lb (119.3 kg)   SpO2 99%   BMI 39.99 kg/m    Review of Systems  Constitutional: Negative.   HENT:  Negative for ear discharge and ear pain (discomfort).   Neurological: Negative.     Physical Exam Constitutional:      Appearance: Normal appearance.  HENT:     Head: Atraumatic.     Right Ear: Tympanic membrane, ear canal and external ear normal. No drainage or swelling. Tympanic membrane is not injected, perforated, erythematous or bulging.     Left Ear: Tympanic membrane, ear canal and external ear normal. No drainage or swelling. Tympanic membrane is not injected, perforated, erythematous or bulging.     Ears:     Comments: In both ears there are small pieces of hair near the TM.     Nose: Nose normal.     Mouth/Throat:     Mouth: Mucous membranes are moist.  Pharynx: Oropharynx is clear.  Eyes:     Extraocular Movements: Extraocular movements intact.  Musculoskeletal:     Cervical back: Neck supple.  Skin:    General: Skin is warm.  Neurological:     Mental Status: She is alert.  Psychiatric:        Mood and Affect: Mood normal.        Behavior: Behavior normal.        Thought Content: Thought content normal.        Judgment: Judgment normal.       Assessment/Plan:  1. Ear discomfort, left  Reviewed my clinical findings with patient. Continue to watch for worsening symptoms. For onset of pain start Tylenol or Ibuprofen.  RTC prn Pt verbalized understanding and in agreement.   General Counseling: Leisl verbalizes understanding of the findings of todays visit and agrees with plan of treatment. I have discussed any further diagnostic  evaluation that may be needed or ordered today. We also reviewed her medications today. she has been encouraged to call the office with any questions or concerns that should arise related to todays visit.    Time spent:20 Minutes    Gilberto Better, [place] Physician Assistant

## 2023-08-21 ENCOUNTER — Encounter: Payer: Self-pay | Admitting: Adult Health

## 2023-08-21 ENCOUNTER — Ambulatory Visit (INDEPENDENT_AMBULATORY_CARE_PROVIDER_SITE_OTHER): Payer: Self-pay | Admitting: Adult Health

## 2023-08-21 VITALS — BP 134/80 | HR 88 | Temp 97.5°F | Wt 267.0 lb

## 2023-08-21 DIAGNOSIS — J0110 Acute frontal sinusitis, unspecified: Secondary | ICD-10-CM

## 2023-08-21 MED ORDER — DOXYCYCLINE HYCLATE 100 MG PO TABS: 100.0000 mg | ORAL_TABLET | Freq: Two times a day (BID) | ORAL | 0 refills | Status: DC

## 2023-08-21 NOTE — Progress Notes (Signed)
[facility] [address] [place], [place] [address]   Office Visit Note  Patient Name: Sheila Hicks Date of Birth [id number]  Medical Record number [id number]  Date of Service: 08/21/2023  Chief Complaint  Patient presents with   Acute Visit     HPI Pt is here for a sick visit. She repots 2 days ago she had some throat discomfort.  She initially thought she scratched it while eating a cookie.  However, it progressed, and felt swollen.  She feels like it is "on fire". At 4 pm yesterday she woke up, and was covered in sweat. She has been gargling with warm salt water, and taking vitamin C.  The throat is mildly improved.    Current Medication:  Outpatient Encounter Medications as of 08/21/2023  Medication Sig   Ascorbic Acid (VITAMIN C) 1000 MG tablet Take 1,000 mg by mouth. occasionally   b complex vitamins capsule Take 1 capsule by mouth daily.   BREO ELLIPTA 200-25 MCG/ACT AEPB Inhale 1 puff into the lungs daily.   CRANBERRY PO Take by mouth daily.   furosemide (LASIX) 20 MG tablet Take 1 tablet (20 mg total) by mouth 2 (two) times daily.   meloxicam (MOBIC) 15 MG tablet Take 1 tablet (15 mg total) by mouth daily.   MOUNJARO 12.5 MG/0.5ML Pen Inject 12.5 mg into the skin once a week.   Multiple Vitamin (MULTIVITAMIN WITH MINERALS) TABS tablet Take 1 tablet by mouth daily.   VITAMIN D-VITAMIN K PO Take by mouth daily.   No facility-administered encounter medications on file as of 08/21/2023.      Medical History: Past Medical History:  Diagnosis Date   Arrhythmia    Diabetes mellitus without complication (HCC)    Heart murmur    LBBB   History of depression    History of hay fever    History of hypertension    Hypertension      Vital Signs: BP 134/80   Pulse 88   Temp (!) 97.5 F (36.4 C) (Tympanic)   Wt 267 lb (121.1 kg)   SpO2 98%   BMI 40.60 kg/m    Review of Systems  Constitutional:  Negative for chills, fatigue and fever.  HENT:   Positive for sore throat and trouble swallowing. Negative for congestion and postnasal drip.   Eyes:  Negative for pain, redness and itching.  Respiratory:  Negative for cough.   Cardiovascular:  Negative for chest pain.  Gastrointestinal:  Negative for diarrhea, nausea and vomiting.    Physical Exam Vitals reviewed.  Constitutional:      Appearance: Normal appearance.  HENT:     Head: Normocephalic.     Right Ear: Tympanic membrane normal.     Left Ear: Tympanic membrane normal.     Nose:     Right Sinus: Frontal sinus tenderness present. No maxillary sinus tenderness.     Left Sinus: Frontal sinus tenderness present. No maxillary sinus tenderness.  Eyes:     Pupils: Pupils are equal, round, and reactive to light.  Pulmonary:     Effort: Pulmonary effort is normal.  Lymphadenopathy:     Cervical: Cervical adenopathy (shotty anterior) present.  Neurological:     Mental Status: She is alert.    Assessment/Plan: 1. Acute non-recurrent frontal sinusitis (Primary) Patient Instructions: -Take complete course of antibiotics as prescribed.  Take with food.  -Try Flonase/Fluticasone nasal spray, 2 sprays to each nostril once a day. -You can try using a neti  pot or nasal saline rinse product to help clear mucus congestion. -Rest and stay well hydrated (by drinking water and other liquids). Avoid/limit caffeine. -Take over-the-counter medicines (i.e. Mucinex, decongestant, Ibuprofen or Tylenol, cough suppressant) to help relieve your symptoms. -For your cough, use cough drops/throat lozenges, gargle warm salt water and/or drink warm liquids (like tea with honey). -Send my chart message to provider or schedule return visit as needed for new/worsening symptoms or if symptoms do not improve as discussed with antibiotic and other recommended treatment.   - doxycycline (VIBRA-TABS) 100 MG tablet; Take 1 tablet (100 mg total) by mouth 2 (two) times daily.  Dispense: 20 tablet; Refill: 0      General Counseling: Jessenya verbalizes understanding of the findings of todays visit and agrees with plan of treatment. I have discussed any further diagnostic evaluation that may be needed or ordered today. We also reviewed her medications today. she has been encouraged to call the office with any questions or concerns that should arise related to todays visit.   No orders of the defined types were placed in this encounter.   No orders of the defined types were placed in this encounter.   Time spent:15 Minutes    Johnna Acosta AGNP-C Nurse Practitioner

## 2023-08-27 ENCOUNTER — Encounter: Payer: Self-pay | Admitting: Adult Health

## 2023-08-28 ENCOUNTER — Other Ambulatory Visit: Payer: Self-pay | Admitting: Adult Health

## 2023-08-28 MED ORDER — ALBUTEROL SULFATE HFA 108 (90 BASE) MCG/ACT IN AERS: 2.0000 | INHALATION_SPRAY | Freq: Four times a day (QID) | RESPIRATORY_TRACT | 0 refills | Status: DC | PRN

## 2023-09-01 ENCOUNTER — Ambulatory Visit: Payer: BC Managed Care – PPO | Admitting: Pulmonary Disease

## 2023-09-11 ENCOUNTER — Ambulatory Visit: Payer: BC Managed Care – PPO | Admitting: Adult Health

## 2023-09-12 ENCOUNTER — Other Ambulatory Visit: Payer: Self-pay

## 2023-09-12 ENCOUNTER — Encounter: Payer: Self-pay | Admitting: Physician Assistant

## 2023-09-12 ENCOUNTER — Ambulatory Visit (INDEPENDENT_AMBULATORY_CARE_PROVIDER_SITE_OTHER): Payer: Self-pay | Admitting: Physician Assistant

## 2023-09-12 VITALS — BP 150/100 | HR 101 | Temp 98.7°F | Ht 68.5 in | Wt 265.0 lb

## 2023-09-12 DIAGNOSIS — S0033XA Contusion of nose, initial encounter: Secondary | ICD-10-CM

## 2023-09-12 NOTE — Progress Notes (Signed)
[facility] [address] [place], [place] [address]   Office Visit Note  Patient Name: Sheila Hicks Date of Birth [id number]  Medical Record number [id number]  Date of Service: 09/12/2023  Chief Complaint  Patient presents with   Facial Injury    Patient reports hitting her nose with her wrist on Tuesday. She c/o severe pain around her nose, swelling, and a throbbing sensation in around her eyes. She states the pain worsens when bending over. She sees spots of blood on her tissue when she blows her nose. She tried to get an xray at [facility] but was told they do not see patients with possible nasal fractures. She notes that her sinuses are very dry and she just finished taking doxycycline for tx of sinusitis.     HPI Pt is here for a sick visit. States she had a sinus infection 2wks ago, got doxycycline, felt better and symptoms seemed to be resolved. Tuesday she accidentally hit her nose with her wrist, and after that she started having nasal pain. No congestion. Has had some rhinorrhea but not much. Mostly just facial pain. Bending over or touching her face hurts. Hasn't taken anything for it, but takes chronic meloxicam. Has been using coconut oil and neosporin to try to "keep her nose moist".  Didn't have epistaxis that day, one time she had a tiny bit of blood tinged snot but not frequent, just once. No blood clots.  No f/c, other URI symptoms, no other symptoms.    ROS: Review of Systems  Constitutional:  Negative for chills and fever.  HENT:  Positive for rhinorrhea (mild) and sinus pain (nasal pain). Negative for congestion, facial swelling and nosebleeds.   Respiratory:  Negative for cough (other than chronic cough).   Allergic/Immunologic: Positive for immunocompromised state.  Hematological:  Does not bruise/bleed easily.     Current Medication:  Outpatient Encounter Medications as of 09/12/2023  Medication Sig   albuterol (VENTOLIN HFA) 108 (90 Base)  MCG/ACT inhaler Inhale 2 puffs into the lungs every 6 (six) hours as needed for wheezing or shortness of breath.   Ascorbic Acid (VITAMIN C) 1000 MG tablet Take 1,000 mg by mouth. occasionally   b complex vitamins capsule Take 1 capsule by mouth daily.   BREO ELLIPTA 200-25 MCG/ACT AEPB Inhale 1 puff into the lungs daily. Out of medication   CRANBERRY PO Take by mouth daily.   furosemide (LASIX) 20 MG tablet Take 1 tablet (20 mg total) by mouth 2 (two) times daily.   MAGNESIUM GLYCINATE PO Take by mouth daily.   meloxicam (MOBIC) 15 MG tablet Take 1 tablet (15 mg total) by mouth daily.   MOUNJARO 12.5 MG/0.5ML Pen Inject 12.5 mg into the skin once a week.   Multiple Vitamin (MULTIVITAMIN WITH MINERALS) TABS tablet Take 1 tablet by mouth daily.   VITAMIN D-VITAMIN K PO Take by mouth daily.   [DISCONTINUED] doxycycline (VIBRA-TABS) 100 MG tablet Take 1 tablet (100 mg total) by mouth 2 (two) times daily.   No facility-administered encounter medications on file as of 09/12/2023.      Medical History: Past Medical History:  Diagnosis Date   Arrhythmia    Diabetes mellitus without complication (HCC)    Heart murmur    LBBB   History of depression    History of hay fever    History of hypertension    Hypertension      Vital Signs: BP (!) 150/100   Pulse Marland Kitchen)  101   Temp 98.7 F (37.1 C)   Ht 5' 8.5" (1.74 m)   Wt 120.2 kg   SpO2 99%   BMI 39.71 kg/m    Physical Exam Vitals and nursing note reviewed.  Constitutional:      General: She is not in acute distress.    Appearance: Normal appearance. She is well-developed. She is not toxic-appearing.     Comments: Afebrile, nontoxic, NAD  HENT:     Head: Normocephalic and atraumatic.     Nose: No nasal deformity, septal deviation, congestion or rhinorrhea.     Right Nostril: No epistaxis, septal hematoma or occlusion.     Left Nostril: No epistaxis, septal hematoma or occlusion.     Right Sinus: No maxillary sinus tenderness or  frontal sinus tenderness.     Left Sinus: No maxillary sinus tenderness or frontal sinus tenderness.     Comments: No bruising or swelling to face/nose, but exquisite TTP to nasal bridge, no deformity, no septal hematoma. No congestion/rhinorrhea. No septal deviation.     Mouth/Throat:     Mouth: Mucous membranes are moist.  Eyes:     General:        Right eye: No discharge.        Left eye: No discharge.     Conjunctiva/sclera: Conjunctivae normal.  Cardiovascular:     Rate and Rhythm: Normal rate.     Pulses: Normal pulses.  Pulmonary:     Effort: Pulmonary effort is normal. No respiratory distress.  Abdominal:     General: There is no distension.  Musculoskeletal:        General: Normal range of motion.     Cervical back: Normal range of motion and neck supple.  Skin:    General: Skin is warm and dry.     Findings: No rash.  Neurological:     Mental Status: She is alert and oriented to person, place, and time.     Sensory: Sensation is intact. No sensory deficit.     Motor: Motor function is intact.  Psychiatric:        Mood and Affect: Mood and affect normal.        Behavior: Behavior normal.       Assessment/Plan:   ICD-10-CM   1. Contusion of nose, initial encounter  S00.33XA        Pt here with likely nasal contusion, possible nasal fx but no evidence of concerning features and nose is straight. Doubt need for imaging, as xray would likely not provide benefit, and CT imaging for isolated minimal trauma nasal eval would be unnecessary. Advised ice, tylenol use, continued meloxicam which is a chronic med. Avoidance of pressure to area. Doubt infectious etiology, no sinus tenderness, and just got over sinusitis with full relief of those symptoms. F/up with PCP as needed if symptoms don't improve over the next 3-4 wks, or sooner if symptoms worsen/change.   General Counseling: Hasset verbalizes understanding of the findings of todays visit and agrees with plan of  treatment. I have discussed any further diagnostic evaluation that may be needed or ordered today. We also reviewed her medications today. she has been encouraged to call the office with any questions or concerns that should arise related to todays visit.   No orders of the defined types were placed in this encounter.   No orders of the defined types were placed in this encounter.   Time spent: [address] PA-C  [employer]  and Staff

## 2023-12-30 ENCOUNTER — Other Ambulatory Visit: Payer: Self-pay | Admitting: Adult Health
